# Patient Record
Sex: Female | Born: 1995
Health system: Southern US, Community
[De-identification: ages and names within clinical notes are randomized; demographics above are authoritative.]

## PROBLEM LIST (undated history)

## (undated) DIAGNOSIS — O1403 Mild to moderate pre-eclampsia, third trimester: Secondary | ICD-10-CM

## (undated) HISTORY — DX: Mild to moderate pre-eclampsia, third trimester: O14.03

---
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
## 2013-02-05 ENCOUNTER — Encounter (HOSPITAL_COMMUNITY): Payer: Self-pay | Admitting: Emergency Medicine

## 2013-02-05 ENCOUNTER — Emergency Department (HOSPITAL_COMMUNITY)
Admission: EM | Admit: 2013-02-05 | Discharge: 2013-02-05 | Disposition: A | Discharge status: Home / Self Care | Payer: Medicaid Other | Attending: Emergency Medicine | Admitting: Emergency Medicine

## 2013-02-05 ENCOUNTER — Emergency Department (HOSPITAL_COMMUNITY): Payer: Medicaid Other

## 2013-02-05 DIAGNOSIS — Y939 Activity, unspecified: Secondary | ICD-10-CM | POA: Insufficient documentation

## 2013-02-05 DIAGNOSIS — Z79899 Other long term (current) drug therapy: Secondary | ICD-10-CM | POA: Insufficient documentation

## 2013-02-05 DIAGNOSIS — S8992XA Unspecified injury of left lower leg, initial encounter: Secondary | ICD-10-CM

## 2013-02-05 DIAGNOSIS — Y9229 Other specified public building as the place of occurrence of the external cause: Secondary | ICD-10-CM | POA: Insufficient documentation

## 2013-02-05 DIAGNOSIS — W208XXA Other cause of strike by thrown, projected or falling object, initial encounter: Secondary | ICD-10-CM | POA: Insufficient documentation

## 2013-02-05 DIAGNOSIS — IMO0002 Reserved for concepts with insufficient information to code with codable children: Secondary | ICD-10-CM | POA: Insufficient documentation

## 2013-02-05 DIAGNOSIS — M79609 Pain in unspecified limb: Secondary | ICD-10-CM | POA: Insufficient documentation

## 2013-02-05 NOTE — ED Provider Notes (Signed)
CSN: [id number]     Arrival date & time 02/05/13  1310 History  This chart was scribed for non-physician practitioner, Emilia Beck, PA-C working with Gerhard Munch, MD by Greggory Stallion, ED scribe. This patient was seen in room TR07C/TR07C and the patient's care was started at 2:05 PM.   Chief Complaint  Patient presents with  . Leg Pain   The history is provided by the patient. No language interpreter was used.   HPI Comments: Melanie Burch is a 17 y.o. female who presents to the Emergency Department complaining of sudden onset, constant left calf pain that started last night after bricks fell onto her leg at a restaurant. Pt denies any other associated symptoms.   History reviewed. No pertinent past medical history. History reviewed. No pertinent past surgical history. History reviewed. No pertinent family history. History  Substance Use Topics  . Smoking status: Never Smoker   . Smokeless tobacco: Not on file  . Alcohol Use: No   OB History   Grav Para Term Preterm Abortions TAB SAB Ect Mult Living                 Review of Systems  Musculoskeletal: Positive for myalgias.  All other systems reviewed and are negative.    Allergies  Review of patient's allergies indicates no known allergies.  Home Medications   Current Outpatient Rx  Name  Route  Sig  Dispense  Refill  . bismuth subsalicylate (PEPTO BISMOL) 262 MG chewable tablet   Oral   Chew 524 mg by mouth as needed for indigestion.         . cephALEXin (KEFLEX) 500 MG capsule   Oral   Take 1 capsule (500 mg total) by mouth 4 (four) times daily.   28 capsule   0   . polyethylene glycol (MIRALAX / GLYCOLAX) packet   Oral   Take 17 g by mouth daily.   14 each   0   . senna (SENOKOT) 8.6 MG tablet   Oral   Take 2 tablets by mouth daily.          BP 128/76  Pulse 72  Temp(Src) 98.6 F (37 C) (Oral)  Resp 19  Wt 200 lb (90.719 kg)  SpO2 100%  Physical Exam  Nursing note and vitals  reviewed. Constitutional: She is oriented to person, place, and time. She appears well-developed and well-nourished. No distress.  HENT:  Head: Normocephalic and atraumatic.  Eyes: EOM are normal.  Neck: Neck supple. No tracheal deviation present.  Cardiovascular: Normal rate.   Pulmonary/Chest: Effort normal. No respiratory distress.  Musculoskeletal: Normal range of motion.  Superficial abrasion to left lateral lower leg. No tenderness to palpation or bruising noted. No tibial tenderness to palpation.   Neurological: She is alert and oriented to person, place, and time.  Skin: Skin is warm and dry.  Psychiatric: She has a normal mood and affect. Her behavior is normal.    ED Course  Procedures (including critical care time)  DIAGNOSTIC STUDIES: Oxygen Saturation is 100% on RA, normal by my interpretation.    COORDINATION OF CARE: 2:06 PM-Discussed treatment plan which includes tylenol or ibuprofen for pain with pt at bedside and pt agreed to plan.   Labs Review Labs Reviewed - No data to display Imaging Review Dg Tibia/fibula Left  02/05/2013   CLINICAL DATA:  Injury left lower leg.  Pain.  EXAM: LEFT TIBIA AND FIBULA - 2 VIEW  COMPARISON:  None.  FINDINGS: There is  no evidence of fracture or other focal bone lesions. Soft tissues are unremarkable.  IMPRESSION: Negative exam.   Electronically Signed   By: Drusilla Kanner M.D.   On: 02/05/2013 13:44    EKG Interpretation   None       MDM   1. Leg injury, left, initial encounter     2:09 PM Patient's xray unremarkable for acute changes. Vitals stable and patient afebrile. No obvious evidence of injury. Patient will be discharged without further evaluation.    I personally performed the services described in this documentation, which was scribed in my presence. The recorded information has been reviewed and is accurate.   Emilia Beck, PA-C 02/05/13 1415

## 2013-02-05 NOTE — ED Notes (Signed)
States bricks fell onto her L leg last night and shes having pain to anterior L calf area below knee since. Ambulatory, cms intact

## 2013-02-08 NOTE — ED Provider Notes (Signed)
  Medical screening examination/treatment/procedure(s) were performed by non-physician practitioner and as supervising physician I was immediately available for consultation/collaboration.      Zaron Zwiefelhofer, MD 02/08/13 0724 

## 2013-05-20 ENCOUNTER — Emergency Department (HOSPITAL_COMMUNITY)
Admission: EM | Admit: 2013-05-20 | Discharge: 2013-05-20 | Disposition: A | Discharge status: Home / Self Care | Payer: Medicaid Other | Attending: Emergency Medicine | Admitting: Emergency Medicine

## 2013-05-20 ENCOUNTER — Encounter (HOSPITAL_COMMUNITY): Payer: Self-pay | Admitting: Emergency Medicine

## 2013-05-20 DIAGNOSIS — K529 Noninfective gastroenteritis and colitis, unspecified: Secondary | ICD-10-CM

## 2013-05-20 DIAGNOSIS — K5289 Other specified noninfective gastroenteritis and colitis: Secondary | ICD-10-CM | POA: Insufficient documentation

## 2013-05-20 LAB — CBC WITH DIFFERENTIAL/PLATELET
Basophils Absolute: 0 10*3/uL (ref 0.0–0.1)
Basophils Relative: 0 % (ref 0–1)
Eosinophils Absolute: 0.2 K/uL (ref 0.0–0.7)
Eosinophils Relative: 2 % (ref 0–5)
HCT: 37.5 % (ref 36.0–46.0)
Hemoglobin: 12.6 g/dL (ref 12.0–15.0)
Lymphocytes Relative: 15 % (ref 12–46)
Lymphs Abs: 1.2 K/uL (ref 0.7–4.0)
MCH: 29.2 pg (ref 26.0–34.0)
MCHC: 33.6 g/dL (ref 30.0–36.0)
MCV: 86.8 fL (ref 78.0–100.0)
Monocytes Absolute: 0.4 10*3/uL (ref 0.1–1.0)
Monocytes Relative: 5 % (ref 3–12)
Neutro Abs: 6.1 10*3/uL (ref 1.7–7.7)
Neutrophils Relative %: 78 % — ABNORMAL HIGH (ref 43–77)
Platelets: 367 10*3/uL (ref 150–400)
RBC: 4.32 MIL/uL (ref 3.87–5.11)
RDW: 12.8 % (ref 11.5–15.5)
WBC: 7.9 K/uL (ref 4.0–10.5)

## 2013-05-20 LAB — COMPREHENSIVE METABOLIC PANEL
AST: 16 U/L (ref 0–37)
Albumin: 3.6 g/dL (ref 3.5–5.2)
Calcium: 9.1 mg/dL (ref 8.4–10.5)
Creatinine, Ser: 0.66 mg/dL (ref 0.50–1.10)
Sodium: 138 mEq/L (ref 137–147)

## 2013-05-20 LAB — COMPREHENSIVE METABOLIC PANEL WITH GFR
ALT: 13 U/L (ref 0–35)
Alkaline Phosphatase: 113 U/L (ref 39–117)
BUN: 8 mg/dL (ref 6–23)
CO2: 24 meq/L (ref 19–32)
Chloride: 104 meq/L (ref 96–112)
GFR calc Af Amer: >90 mL/min (ref >90)
GFR calc non Af Amer: >90 mL/min (ref >90)
Glucose, Bld: 74 mg/dL (ref 70–99)
Potassium: 4.1 meq/L (ref 3.7–5.3)
Total Bilirubin: 0.3 mg/dL (ref 0.3–1.2)
Total Protein: 8.3 g/dL (ref 6.0–8.3)

## 2013-05-20 LAB — POC URINE PREG, ED: Preg Test, Ur: NEGATIVE

## 2013-05-20 LAB — URINE MICROSCOPIC-ADD ON

## 2013-05-20 LAB — URINALYSIS, ROUTINE W REFLEX MICROSCOPIC
Bilirubin Urine: NEGATIVE
Glucose, UA: NEGATIVE mg/dL
Hgb urine dipstick: NEGATIVE
Ketones, ur: NEGATIVE mg/dL
Nitrite: NEGATIVE
Protein, ur: NEGATIVE mg/dL
Specific Gravity, Urine: 1.025 (ref 1.005–1.030)
Urobilinogen, UA: 1 mg/dL (ref 0.0–1.0)
pH: 5.5 (ref 5.0–8.0)

## 2013-05-20 LAB — LIPASE, BLOOD: Lipase: 18 U/L (ref 11–59)

## 2013-05-20 MED ORDER — SODIUM CHLORIDE 0.9 % IV BOLUS (SEPSIS)
1000.0000 mL | Freq: Once | INTRAVENOUS | Status: AC
  Administered 2013-05-20: 1000 mL via INTRAVENOUS

## 2013-05-20 MED ORDER — ONDANSETRON 4 MG PO TBDP
8.0000 mg | ORAL_TABLET | Freq: Once | ORAL | Status: DC
  Filled 2013-05-20: qty 2

## 2013-05-20 MED ORDER — ONDANSETRON HCL 4 MG/2ML IJ SOLN
4.0000 mg | Freq: Once | INTRAMUSCULAR | Status: AC
  Administered 2013-05-20: 4 mg via INTRAVENOUS
  Filled 2013-05-20: qty 2

## 2013-05-20 MED ORDER — ONDANSETRON 4 MG PO TBDP
8.0000 mg | ORAL_TABLET | Freq: Once | ORAL | Status: AC
  Administered 2013-05-20: 8 mg via ORAL
  Filled 2013-05-20: qty 2

## 2013-05-20 MED ORDER — CEPHALEXIN 500 MG PO CAPS: 500.0000 mg | ORAL_CAPSULE | Freq: Four times a day (QID) | ORAL | Status: DC

## 2013-05-20 MED ORDER — ONDANSETRON 8 MG PO TBDP: ORAL_TABLET | ORAL | Status: DC

## 2013-05-20 NOTE — ED Notes (Signed)
Pt states that she threw up the Zofran that she received in triage, but also states that she does not feel nauseous at this time.

## 2013-05-20 NOTE — ED Provider Notes (Signed)
Medical screening examination/treatment/procedure(s) were performed by non-physician practitioner and as supervising physician I was immediately available for consultation/collaboration.   EKG Interpretation None        Shenandoah Yeats Y. Nakenya Theall, MD 05/20/13 2229 

## 2013-05-20 NOTE — ED Notes (Signed)
Pt hasn't eaten all day and has been nauseated. Went to work at [employer] and tried drinking some sierra mist but vomited it back up and started having diarrhea which was around 1300.

## 2013-05-20 NOTE — ED Notes (Signed)
Muthersbaugh, PA is aware that the pt is having nausea.

## 2013-05-20 NOTE — ED Notes (Signed)
This RN went into the pt's room to give her zofran. Pt states that she was not feeling nauseous, stating that she had to "burp", which relieved the nauseous feeling. Muthersbaugh, PA is aware.

## 2013-05-20 NOTE — ED Notes (Signed)
This RN unable to find a site for an IV. Will ask another nurse.

## 2013-05-20 NOTE — Discharge Instructions (Signed)
1. Medications: keflex, zofran, usual home medications 2. Treatment: rest, drink plenty of fluids,  3. Follow Up: Please followup with your primary doctor for discussion of your diagnoses and further evaluation after today's visit; if you do not have a primary care doctor use the resource guide provided to find one;    Viral Gastroenteritis Viral gastroenteritis is also known as stomach flu. This condition affects the stomach and intestinal tract. It can cause sudden diarrhea and vomiting. The illness typically lasts 3 to 8 days. Most people develop an immune response that eventually gets rid of the virus. While this natural response develops, the virus can make you quite ill. CAUSES  Many different viruses can cause gastroenteritis, such as rotavirus or noroviruses. You can catch one of these viruses by consuming contaminated food or water. You may also catch a virus by sharing utensils or other personal items with an infected person or by touching a contaminated surface. SYMPTOMS  The most common symptoms are diarrhea and vomiting. These problems can cause a severe loss of body fluids (dehydration) and a body salt (electrolyte) imbalance. Other symptoms may include:  Fever.  Headache.  Fatigue.  Abdominal pain. DIAGNOSIS  Your caregiver can usually diagnose viral gastroenteritis based on your symptoms and a physical exam. A stool sample may also be taken to test for the presence of viruses or other infections. TREATMENT  This illness typically goes away on its own. Treatments are aimed at rehydration. The most serious cases of viral gastroenteritis involve vomiting so severely that you are not able to keep fluids down. In these cases, fluids must be given through an intravenous line (IV). HOME CARE INSTRUCTIONS   Drink enough fluids to keep your urine clear or pale yellow. Drink small amounts of fluids frequently and increase the amounts as tolerated.  Ask your caregiver for specific  rehydration instructions.  Avoid:  Foods high in sugar.  Alcohol.  Carbonated drinks.  Tobacco.  Juice.  Caffeine drinks.  Extremely hot or cold fluids.  Fatty, greasy foods.  Too much intake of anything at one time.  Dairy products until 24 to 48 hours after diarrhea stops.  You may consume probiotics. Probiotics are active cultures of beneficial bacteria. They may lessen the amount and number of diarrheal stools in adults. Probiotics can be found in yogurt with active cultures and in supplements.  Wash your hands well to avoid spreading the virus.  Only take over-the-counter or prescription medicines for pain, discomfort, or fever as directed by your caregiver. Do not give aspirin to children. Antidiarrheal medicines are not recommended.  Ask your caregiver if you should continue to take your regular prescribed and over-the-counter medicines.  Keep all follow-up appointments as directed by your caregiver. SEEK IMMEDIATE MEDICAL CARE IF:   You are unable to keep fluids down.  You do not urinate at least once every 6 to 8 hours.  You develop shortness of breath.  You notice blood in your stool or vomit. This may look like coffee grounds.  You have abdominal pain that increases or is concentrated in one small area (localized).  You have persistent vomiting or diarrhea.  You have a fever.  The patient is a child younger than 3 months, and he or she has a fever.  The patient is a child older than 3 months, and he or she has a fever and persistent symptoms.  The patient is a child older than 3 months, and he or she has a fever and symptoms  suddenly get worse.  The patient is a baby, and he or she has no tears when crying. MAKE SURE YOU:   Understand these instructions.  Will watch your condition.  Will get help right away if you are not doing well or get worse. Document Released: 02/28/2005 Document Revised: 05/23/2011 Document Reviewed:  12/15/2010 [facility] Patient Information 2014 [employer], [place].    Emergency Department Resource Guide 1) Find a Doctor and Pay Out of Pocket Although you won't have to find out who is covered by your insurance plan, it is a good idea to ask around and get recommendations. You will then need to call the office and see if the doctor you have chosen will accept you as a new patient and what types of options they offer for patients who are self-pay. Some doctors offer discounts or will set up payment plans for their patients who do not have insurance, but you will need to ask so you aren't surprised when you get to your appointment.  2) Contact Your Local Health Department Not all health departments have doctors that can see patients for sick visits, but many do, so it is worth a call to see if yours does. If you don't know where your local health department is, you can check in your phone book. The CDC also has a tool to help you locate your state's health department, and many state websites also have listings of all of their local health departments.  3) Find a Walk-in Clinic If your illness is not likely to be very severe or complicated, you may want to try a walk in clinic. These are popping up all over the country in pharmacies, drugstores, and shopping centers. They're usually staffed by nurse practitioners or physician assistants that have been trained to treat common illnesses and complaints. They're usually fairly quick and inexpensive. However, if you have serious medical issues or chronic medical problems, these are probably not your best option.  No Primary Care Doctor: - Call Health Connect at  [phone] - they can help you locate a primary care doctor that  accepts your insurance, provides certain services, etc. - Physician Referral Service- [phone]  Chronic Pain Problems: Organization         Address  Phone   Notes  [facility]  [phone] Patients need to  be referred by their primary care doctor.   Medication Assistance: Organization         Address  Phone   Notes  [facility] [address] [place], [place] [address] [phone]) [phone] --Must be a resident of [facility] [place] -- Must have NO insurance coverage whatsoever (no Medicaid/ Medicare, etc.) -- The pt. MUST have a primary care doctor that directs their care regularly and follows them in the community   MedAssist  [phone]   [employer]  [phone]) [phone]    Agencies that provide inexpensive medical care: Organization         Address  Phone   Notes  [facility]  [phone]) [phone]   [facility]    [phone]) [phone]   [facility] [address] [place], [place] [address] [phone]) [phone]   [facility] [place] [address], [place] [phone]   [facility]    [phone]   [facility]    [phone]   [facility]  [address], [place]  Phone:  [phone], Fax:  [phone] Hours of Operation:  9 am - 6 pm, M-F.  Also accepts Medicaid/Medicare and self-pay.  [facility]  [address], [place] Phone: [phone], Fax: [phone]. Hours of Operation:  8:30 am - 5:30 pm, M-F.  Also accepts Medicaid and self-pay.  [facility] [place] [address] [place] Phone: [phone]   [facility] [address] Natasha Bence [place], [place] [phone], Ext. [phone] Mondays & Thursdays: 7-9 AM.  First 15 patients are seen on a first come, first serve basis.    Medicaid-accepting [facility] Providers:  Organization         Address  Phone   Notes  [facility] [address], [place] [phone] Also accepts self-pay patients.  [facility] [address] Laurell Josephs [place], [place]  [phone]   [facility] [address], [place] [phone]   [facility] [address], [place] [phone]   Renaye Rakers [address], [place]   [phone] Only accepts [place] Access [place] patients after they have their name applied to their card.   Self-Pay (no insurance) in [facility]:  Organization         Address  Phone   Notes  Sickle Cell Patients, [facility] [address] [place], [place] [phone]   [facility] [address] [place], [place] [phone]   [facility] [place]  [address], [place] [phone]   [facility]/Dr. Osei-Bonsu  [address] [place] or [address], [place] [phone] Phone number for both [place] and [place] locations is the same.  [facility] [address] [place] [phone]   [facility] [address], [place] or [address] [phone]   [facility] [address] [place] [phone], phone; [phone], fax Sees patients 1st and 3rd Saturday of every month.  Must not qualify for public or private insurance (i.e. Medicaid, Medicare, [place] Health Choice, Veterans' Benefits)  Household income should be no more than 200% of the poverty level The clinic cannot treat you if you are pregnant or think you are pregnant  Sexually transmitted diseases are not treated at the clinic.    Dental Care: Organization         Address  Phone  Notes  [facility] Department of [facility] [address] [place], [place] [phone] Accepts children up to age 63 who are enrolled in [place] or [place] Health Choice; pregnant women with a Medicaid card; and children who have applied for Medicaid or [place] Health Choice, but were declined, whose parents can  pay a reduced fee at time of service.  [facility] Department of [facility]  [address], [place] [phone] Accepts children up to age 30 who are enrolled in [place] or [place] Health Choice; pregnant women with a Medicaid card; and children who have applied for Medicaid or [place] Health Choice, but were declined, whose parents can pay a reduced fee at time of service.  [facility]  [address] [place], [place] [phone] Patients are seen by appointment  only. Walk-ins are not accepted. [facility] will see patients 20 years of age and older. Monday - Tuesday (8am-5pm) Most Wednesdays (8:30-5pm) $30 per visit, cash only  [facility]  [address], [facility] [place] [phone] Patients are seen by appointment only. Walk-ins are not accepted. [facility] will see patients 20 years of age and older. One Wednesday Evening (Monthly: Volunteer Based).  $30 per visit, cash only  [facility]  [phone]) [phone] for adults; Children under age 154, call Graduate Pediatric Dentistry at [phone]. Children aged 234-14, please call [phone]) [phone] to request a pediatric application.  Dental services are provided in all areas of dental care including fillings, crowns and bridges, complete and partial dentures, implants, gum treatment, root canals, and extractions. Preventive care is also provided. Treatment is provided to both adults and children. Patients are selected via a lottery and there is often a waiting list.   [facility] [address], [place]  [phone]) [phone] [email or web address]   [facility] [address], [place], [place] [phone], Ext. [phone] Second and Fourth Thursday of each month, opens at 6:30 AM; Clinic ends at 9 AM.  Patients are seen on a first-come first-served basis, and a limited number are seen during each clinic.   [facility]  [address], [place], [place] [phone]) [phone]   Eligibility Requirements You must have lived in [place], [place], or [place] counties for at least the last three months.   You cannot be eligible for state or federal sponsored [place] Cityhealthcare insurance, including CIGNAVeterans Administration, [place]icaid, or [employer] EntertainmentMedicare.   You generally cannot be eligible for healthcare insurance through your employer.    How to apply: Eligibility screenings are held every Tuesday and Wednesday afternoon from 1:00 pm until 4:00 pm. You do not need an appointment for the interview!  [facility] [address], [place], [place] [phone]   [facility]  [phone]   [facility]  [phone]   [facility]  [phone]    Behavioral Health Resources in the Community: Intensive Outpatient Programs Organization         Address  Phone  Notes  [facility] [address], [place], [place] [phone]   [facility] [address], [place], [place] [phone]   [facility] [address], [place], [place]  [phone]   [facility] [address],  [place], [place] [phone] or [phone]   Substance Abuse Resources Organization         Address  Phone  Notes  [facility]  [phone]   [facility]  [phone]   [facility]  [phone]   [facility]  [phone]   Residential & Outpatient Substance Abuse Program  [phone]   Psychological Services Organization         Address  Phone  Notes  [facility]  [phone]- [phone]   [facility]  [phone]- [phone]   [facility] [address], [place] [phone] or [phone]    Mobile Crisis Teams Organization         Address  Phone  Notes  [facility], Mobile Crisis Care Unit  [phone]    [facility]  [address] [place], [place] [phone]   [facility] [address] [place] [phone]  Self-Help/Support Groups Organization         Address  Phone             Notes  [facility] of [place] - variety of support groups  336- I7437963 Call for more information  Narcotics Anonymous (NA), [facility] [address], [employer] Freedom  2 meetings at this location   Statistician         Address  Phone  Notes  [facility] [address],    [place]  [phone]   [facility]  [address] [place] [address], [place], [place] [phone]   [facility] [address] [place], [place] [phone] Admissions: 8am-3pm M-F  [facility] [address]    [place], [place] [phone]   [facility] [address] [place], [place], [place] [phone]   [facility] [address]  [place], [place] [phone]   [facility] - Intensive Outpatient [address]ure[address] Josephs [address], [place], [place] [phone]   [facility] ([facility] [address] [place].,  [place], [place] [phone] or [phone]   [facility] ([facility]) [address], [place], [place] [phone] Accepts Medicaid  [facility] [address],  [place] [phone] Substance Abuse/Addiction Treatment   [facility] Organization         Address  Phone  Notes  [facility]  [phone]   Angie Fava, PhD [address] Ervin Knack [place], [place]   [phone] or [phone]   [facility]   [address] [place], [place] [phone]   [facility] [address], [place], [place] [phone] Insurance/Medicaid/sponsorship through [facility] and Families [address]                                     [place], [place] [phone] Therapy/tele-psych/case  [facility] [address], [place] [phone]    Dr. Lolly Mustache  [phone]   [facility] [place]  [employer] [facility] Dept. 1) [address] [place] 2) [address], [place] 3)  [address], [place] [phone]  [phone]   [facility] Child Abuse Hotline (732)849-8000 or [phone] (After Hours)

## 2013-05-20 NOTE — ED Notes (Signed)
Muthersbaugh, PA at bedside.

## 2013-05-20 NOTE — ED Provider Notes (Signed)
CSN: [id number]     Arrival date & time 05/20/13  1728 History   First MD Initiated Contact with Patient 05/20/13 1921     Chief Complaint  Patient presents with  . Nausea     (Consider location/radiation/quality/duration/timing/severity/associated sxs/prior Treatment) The history is provided by the patient and medical records. No language interpreter was used.    Melanie Burch is a 18 y.o. female  with no medical Hx presents to the Emergency Department complaining of intermittent nausea and vomiting onset 1pm.  Pt reports she had associated intermittent mild abd cramping only just before she vomits and then symptoms are relieved after vomiting.  She reports NBNB emesis and diarrhea without frank blood or melena.  Pt denies fever, headache, neck pain, CP, SOB, weakness, dizziness, syncope, dysuria, hematuria.  Pt reports trying to drink sierra mist and ginger ale without relief.  She reports the RN gave a "nausea tablet" after which she vomited.  LMP: 1 week ago and normal.  Nothing makes her symptoms worse and nothing makes it better.  Denies vaginal ssx including vaginal discharge and odor.  Also denies lower abd pain.  History reviewed. No pertinent past medical history. History reviewed. No pertinent past surgical history. No family history on file. History  Substance Use Topics  . Smoking status: Never Smoker   . Smokeless tobacco: Not on file  . Alcohol Use: No   OB History   Grav Para Term Preterm Abortions TAB SAB Ect Mult Living                 Review of Systems  Constitutional: Negative for fever, diaphoresis, appetite change, fatigue and unexpected weight change.  HENT: Negative for mouth sores and trouble swallowing.   Respiratory: Negative for cough, chest tightness, shortness of breath, wheezing and stridor.   Cardiovascular: Negative for chest pain and palpitations.  Gastrointestinal: Positive for nausea, vomiting and abdominal pain (intermittent cramping).  Negative for diarrhea, constipation, blood in stool, abdominal distention and rectal pain.  Genitourinary: Negative for dysuria, urgency, frequency, hematuria, flank pain and difficulty urinating.  Musculoskeletal: Negative for back pain, neck pain and neck stiffness.  Skin: Negative for rash.  Neurological: Negative for weakness.  Hematological: Negative for adenopathy.  Psychiatric/Behavioral: Negative for confusion.  All other systems reviewed and are negative.      Allergies  Review of patient's allergies indicates no known allergies.  Home Medications   Current Outpatient Rx  Name  Route  Sig  Dispense  Refill  . ibuprofen (ADVIL,MOTRIN) 200 MG tablet   Oral   Take 400 mg by mouth daily as needed for cramping.         . cephALEXin (KEFLEX) 500 MG capsule   Oral   Take 1 capsule (500 mg total) by mouth 4 (four) times daily.   40 capsule   0   . ondansetron (ZOFRAN ODT) 8 MG disintegrating tablet      8mg  ODT q4 hours prn nausea   4 tablet   0    BP 128/65  Pulse 83  Temp(Src) 99.3 F (37.4 C) (Oral)  Resp 20  SpO2 100%  LMP 05/13/2013 Physical Exam  Nursing note and vitals reviewed. Constitutional: She is oriented to person, place, and time. She appears well-developed and well-nourished. No distress.  Awake, alert, nontoxic appearance  HENT:  Head: Normocephalic and atraumatic.  Mouth/Throat: Oropharynx is clear and moist. No oropharyngeal exudate.  Eyes: Conjunctivae are normal. Pupils are equal, round, and reactive to light. No  scleral icterus.  Neck: Normal range of motion. Neck supple.  Cardiovascular: Normal rate, regular rhythm, normal heart sounds and intact distal pulses.   No tachycardia  Pulmonary/Chest: Effort normal and breath sounds normal. No respiratory distress. She has no wheezes.  Clear and equal breath sounds  Abdominal: Soft. Bowel sounds are normal. She exhibits no distension and no mass. There is no tenderness. There is no rebound  and no guarding.  abd soft and nontender  Musculoskeletal: Normal range of motion. She exhibits no edema.  No edema  Lymphadenopathy:    She has no cervical adenopathy.  Neurological: She is alert and oriented to person, place, and time. She exhibits normal muscle tone. Coordination normal.  Speech is clear and goal oriented Moves extremities without ataxia  Skin: Skin is warm and dry. No rash noted. She is not diaphoretic. No erythema.  Psychiatric: She has a normal mood and affect.    ED Course  Procedures (including critical care time) Labs Review Labs Reviewed  CBC WITH DIFFERENTIAL - Abnormal; Notable for the following:    Neutrophils Relative % 78 (*)    All other components within normal limits  URINALYSIS, ROUTINE W REFLEX MICROSCOPIC - Abnormal; Notable for the following:    APPearance CLOUDY (*)    Leukocytes, UA SMALL (*)    All other components within normal limits  URINE MICROSCOPIC-ADD ON - Abnormal; Notable for the following:    Squamous Epithelial / LPF FEW (*)    Bacteria, UA MANY (*)    All other components within normal limits  URINE CULTURE  COMPREHENSIVE METABOLIC PANEL  LIPASE, BLOOD  POC URINE PREG, ED   Imaging Review No results found.   EKG Interpretation None      MDM   Final diagnoses:  Gastroenteritis   [name] presents with several episodes of vomiting and 1 episode of loose stool today.  No abd pain and benign abd.  Labs reassuring.  UA pending. Pt works at [employer] and is exposed to [employer].    Labs reassuring. Pregnancy test negative. No leukocytosis. Urinalysis with questionable urinary tract infection the patient denies symptoms.  Discussed with her.  Will write antibiotic prescription and culture.  9:33 PM Patient feels much better after fluid and Zofran administration. Patient's abdomen remains benign. She reports no further emesis and nausea has resolved. Will by mouth trial. If no further emesis in the department  we'll discharge home.  10:06 PM Patient with symptoms consistent with viral gastritis.  Vitals are stable, no fever.  Patient is nontoxic, nonseptic appearing, in no apparent distress.  Patient does not meet the SIRS or Sepsis criteria.  Pt's symptoms have been managed in the department; fluid bolus given.  No signs of dehydration, tolerating PO fluids > 6 oz.  Lungs are clear.  No focal abdominal pain, no peritoneal signs, no concern for appendicitis, cholecystitis, pancreatitis, ruptured viscus, UTI, kidney stone, PID, ectopic pregnancy or any other abdominal etiology.  Supportive therapy indicated with return if symptoms worsen.  Patient counseled.  I have also discussed reasons to return immediately to the ER.  Patient expresses understanding and agrees with plan.  It has been determined that no acute conditions requiring further emergency intervention are present at this time. The patient/guardian have been advised of the diagnosis and plan. We have discussed signs and symptoms that warrant return to the ED, such as changes or worsening in symptoms.   Vital signs are stable at discharge.   BP 128/65  Pulse 83  Temp(Src) 99.3 F (37.4 C) (Oral)  Resp 20  SpO2 100%  LMP 05/13/2013  Patient/guardian has voiced understanding and agreed to follow-up with the PCP or specialist.         Dierdre ForthHannah Devondre Guzzetta, PA-C 05/20/13 2206

## 2013-05-20 NOTE — ED Notes (Signed)
Pt called out reporting nausea.  

## 2013-05-22 LAB — URINE CULTURE: Colony Count: >=100000

## 2014-02-09 ENCOUNTER — Encounter (HOSPITAL_COMMUNITY): Payer: Self-pay | Admitting: *Deleted

## 2014-02-09 ENCOUNTER — Emergency Department (HOSPITAL_COMMUNITY)
Admission: EM | Admit: 2014-02-09 | Discharge: 2014-02-09 | Disposition: A | Discharge status: Home / Self Care | Payer: Medicaid Other | Attending: Emergency Medicine | Admitting: Emergency Medicine

## 2014-02-09 DIAGNOSIS — Z3202 Encounter for pregnancy test, result negative: Secondary | ICD-10-CM | POA: Insufficient documentation

## 2014-02-09 DIAGNOSIS — J040 Acute laryngitis: Secondary | ICD-10-CM | POA: Insufficient documentation

## 2014-02-09 DIAGNOSIS — L732 Hidradenitis suppurativa: Secondary | ICD-10-CM | POA: Diagnosis not present

## 2014-02-09 DIAGNOSIS — J029 Acute pharyngitis, unspecified: Secondary | ICD-10-CM | POA: Diagnosis present

## 2014-02-09 LAB — POC URINE PREG, ED: Preg Test, Ur: NEGATIVE

## 2014-02-09 LAB — MONONUCLEOSIS SCREEN: MONO SCREEN: NEGATIVE

## 2014-02-09 LAB — RAPID STREP SCREEN (MED CTR MEBANE ONLY): Streptococcus, Group A Screen (Direct): NEGATIVE

## 2014-02-09 MED ORDER — IBUPROFEN 100 MG/5ML PO SUSP
600.0000 mg | Freq: Once | ORAL | Status: AC
  Administered 2014-02-09: 600 mg via ORAL
  Filled 2014-02-09: qty 30

## 2014-02-09 MED ORDER — CEPHALEXIN 250 MG PO CAPS: 250.0000 mg | ORAL_CAPSULE | Freq: Four times a day (QID) | ORAL | Status: DC

## 2014-02-09 NOTE — Discharge Instructions (Signed)
Please call your doctor for a followup appointment within 24-48 hours. When you talk to your doctor please let them know that you were seen in the emergency department and have them acquire all of your records so that they can discuss the findings with you and formulate a treatment plan to fully care for your new and ongoing problems. Please call and set-up an appointment with ear, nose, throat physician Please call and set up an appointment with general surgery regarding recurrent skin infections Please take antibiotics as prescribed and on a full stomach Please apply warm compressions to the armpits bilaterally Please rest voice and drink warm fluids such as tea Please continue to monitor symptoms closely and if symptoms are to worsen or change (fever greater than 101, chills, sweating, nausea, vomiting, chest pain, shortness of breathe, difficulty breathing, weakness, numbness, tingling, worsening or changes to pain pattern, neck swelling, neck pain, inability to swallow, difficulty swallowing, red streaks, swelling to the arms, bleeding or drainage from the arms) please report back to the Emergency Department immediately.    Laryngitis Laryngitis is redness, soreness, and puffiness (inflammation) of the vocal cords. It causes hoarseness, cough, loss of voice, sore throat, and dry throat. It may be caused by:  Infection.  Too much smoking.  Too much talking or yelling.  Breathing in of toxic fumes.  Allergies.  A backup of acid from your stomach. HOME CARE  Drink enough fluids to keep your pee (urine) clear or pale yellow.  Rest until you no longer have problems or as told by your doctor.  Breathe in moist air.  Take all medicine as told by your doctor.  Do not smoke.  Talk as little as possible (this includes whispering).  Write on paper instead of talking until your voice is back to normal.  Follow up with your doctor if you have not improved after 10 days. GET HELP IF:     You have trouble breathing.  You cough up blood.  You have a fever that will not go away.  You have increasing pain.  You have trouble swallowing. MAKE SURE YOU:  Understand these instructions.  Will watch your condition.  Will get help right away if you are not doing well or get worse. Document Released: 02/17/2011 Document Revised: 05/23/2011 Document Reviewed: 02/17/2011 [facility] Patient Information 2015 [place]Care, [place]. This information is not intended to replace advice given to you by your health care provider. Make sure you discuss any questions you have with your health care provider.  Hidradenitis Suppurativa, Sweat Gland Abscess Hidradenitis suppurativa is a long lasting (chronic), uncommon disease of the sweat glands. With this, boil-like lumps and scarring develop in the groin, some times under the arms (axillae), and under the breasts. It may also uncommonly occur behind the ears, in the crease of the buttocks, and around the genitals.  CAUSES  The cause is from a blocking of the sweat glands. They then become infected. It may cause drainage and odor. It is not contagious. So it cannot be given to someone else. It most often shows up in puberty (about 6010 to 20 years of age). But it may happen much later. It is similar to acne which is a disease of the sweat glands. This condition is slightly more common in African-Americans and women. SYMPTOMS   Hidradenitis usually starts as one or more red, tender, swellings in the groin or under the arms (axilla).  Over a period of hours to days the lesions get larger. They  often open to the skin surface, draining clear to yellow-colored fluid.  The infected area heals with scarring. DIAGNOSIS  Your caregiver makes this diagnosis by looking at you. Sometimes cultures (growing germs on plates in the lab) may be taken. This is to see what germ (bacterium) is causing the infection.  TREATMENT   Topical germ killing medicine applied  to the skin (antibiotics) are the treatment of choice. Antibiotics taken by mouth (systemic) are sometimes needed when the condition is getting worse or is severe.  Avoid tight-fitting clothing which traps moisture in.  Dirt does not cause hidradenitis and it is not caused by poor hygiene.  Involved areas should be cleaned daily using an antibacterial soap. Some patients find that the liquid form of Lever 2000, applied to the involved areas as a lotion after bathing, can help reduce the odor related to this condition.  Sometimes surgery is needed to drain infected areas or remove scarred tissue. Removal of large amounts of tissue is used only in severe cases.  Birth control pills may be helpful.  Oral retinoids (vitamin A derivatives) for 6 to 12 months which are effective for acne may also help this condition.  Weight loss will improve but not cure hidradenitis. It is made worse by being overweight. But the condition is not caused by being overweight.  This condition is more common in people who have had acne.  It may become worse under stress. There is no medical cure for hidradenitis. It can be controlled, but not cured. The condition usually continues for years with periods of getting worse and getting better (remission). Document Released: 10/13/2003 Document Revised: 05/23/2011 Document Reviewed: 05/31/2013 [facility] Patient Information 2015 [place]ExitCare, [place]LLC. This information is not intended to replace advice given to you by your health care provider. Make sure you discuss any questions you have with your health care provider.   Emergency Department Resource Guide 1) Find a Doctor and Pay Out of Pocket Although you won't have to find out who is covered by your insurance plan, it is a good idea to ask around and get recommendations. You will then need to call the office and see if the doctor you have chosen will accept you as a new patient and what types of options they offer for  patients who are self-pay. Some doctors offer discounts or will set up payment plans for their patients who do not have insurance, but you will need to ask so you aren't surprised when you get to your appointment.  2) Contact Your Local Health Department Not all health departments have doctors that can see patients for sick visits, but many do, so it is worth a call to see if yours does. If you don't know where your local health department is, you can check in your phone book. The CDC also has a tool to help you locate your state's health department, and many state websites also have listings of all of their local health departments.  3) Find a Walk-in Clinic If your illness is not likely to be very severe or complicated, you may want to try a walk in clinic. These are popping up all over the country in pharmacies, drugstores, and shopping centers. They're usually staffed by nurse practitioners or physician assistants that have been trained to treat common illnesses and complaints. They're usually fairly quick and inexpensive. However, if you have serious medical issues or chronic medical problems, these are probably not your best option.  No Primary Care Doctor: - Call  Health Connect at  [phone] - they can help you locate a primary care doctor that  accepts your insurance, provides certain services, etc. - Physician Referral Service- [phone]  Chronic Pain Problems: Organization         Address  Phone   Notes  [facility]  [phone] Patients need to be referred by their primary care doctor.   Medication Assistance: Organization         Address  Phone   Notes  [facility] [address] [place], [place] [address] [phone] --Must be a resident of [facility] -- Must have NO insurance coverage whatsoever (no Medicaid/ Medicare, etc.) -- The pt. MUST have a primary care doctor that directs their care regularly  and follows them in the community   [facility]  [phone]   [employer]  [phone]    Agencies that provide inexpensive medical care: Organization         Address  Phone   Notes  [place]  [phone]   [facility]    [phone]   [facility] [place], [place] [address] [phone]   [place] [address] [place] [phone]   Planned Parenthood    [phone]   [facility]    [phone]   [place] [address], [place] Phone:  [phone], Fax:  [phone] Hours of Operation:  9 am - 6 pm, M-F.  Also accepts Medicaid/Medicare and self-pay.  [facility] for [place], [address], [place] Phone: [phone], Fax: [phone]. Hours of Operation:  8:30 am - 5:30 pm, M-F.  Also accepts Medicaid and self-pay.  [facility] [place] [address], [place] Phone: [phone]   [place], [place], [place] [phone], Ext. [phone] Mondays & Thursdays: 7-9 AM.  First 15 patients are seen on a first come, first serve basis.    [place] Providers:  Organization         Address  Phone   Notes  [facility] [address], [place] [phone] Also accepts self-pay patients.  [facility] [address], [place]  [phone]   [place], [address], [place] [phone]   [facility] [address], [place] [phone]   Lucianne Lei [address], [place]   [phone] Only accepts [place] Access [place] patients after they have their name applied to their card.   Self-Pay (no insurance) in [facility]:  Organization         Address  Phone   Notes  Sickle  Cell Patients, [facility] Internal [facility] [place] [phone]   [facility] [place] [phone]   [facility] [place]  [place], [place], [place] [phone]   [facility]/Dr. Osei-Bonsu  [address], [place] or [address], [place] [phone] Phone number for both [place] and [place] locations is the same.  [facility] [address] [place] [phone]   [place]  [address], [place] or [address] [phone]   [facility] [place] [phone], phone; [phone], fax Sees patients 1st and 3rd Saturday of every month.  Must not qualify for public or private insurance (i.e. Medicaid, Medicare, [place] Health Choice, Veterans' Benefits)  Household income should be no more than 200% of the poverty level The clinic cannot treat you if you are pregnant or think you are pregnant  Sexually transmitted diseases are not treated at the clinic.    Dental Care: Organization         Address  Phone  Notes  [facility] Department of [facility] [phone] Accepts children up to age 76 who are enrolled in [place] or [place]; pregnant women with a Medicaid card; and children who have applied for Medicaid or [place] Health Choice, but were declined, whose parents can pay a reduced fee at time of service.  [facility] of [facility]  [address], [place] [phone] Accepts children up to age 67 who are enrolled in [place] or [place]; pregnant women with a Medicaid card; and children who have applied for Medicaid or [place] Health Choice, but were declined, whose parents can pay a reduced fee at time of service.  [facility]  [place] [phone] Patients are seen by appointment only. Walk-ins are not accepted. Bernice will see patients 59 years of age and older. Monday - Tuesday (8am-5pm) Most Wednesdays (8:30-5pm) $30 per visit, cash only  [facility] Adult [facility] Access PROGRAM  [address], [place] [employer] [phone] Patients are seen by appointment only. Walk-ins are not accepted. Fernando Salinas will see patients 59 years of age and older. One Wednesday Evening (Monthly: Volunteer Based).  $30 per visit, cash only  [place]  [phone] for adults; Children under age 88, call Graduate Pediatric Dentistry at [phone]. Children aged 26-14, please call [phone] to request a pediatric application.  Dental services are provided in all areas of dental care including fillings, crowns and bridges, complete and partial dentures, implants, gum treatment, root canals, and extractions. Preventive care is also provided. Treatment is provided to both adults and children. Patients are selected via a lottery and there is often a waiting list.   [facility] [address] [place]  [phone] [email or web address]   [facility] [address] [place], [place] [phone], Ext. [phone] Second and Fourth Thursday of each month, opens at 6:30 AM; Clinic ends at 9 AM.  Patients are seen on a first-come first-served basis, and a limited number are seen during each clinic.   [facility]  [address] [place] Danker [place], [place] [phone]   Eligibility Requirements You must have lived in [place], [place], or [place] counties for at least the last three months.   You cannot be eligible for state or federal sponsored [employer], including [employer], [place], or [employer].   You generally cannot be eligible for healthcare insurance through your employer.    How to apply: Eligibility  screenings are held every Tuesday and Wednesday afternoon from 1:00 pm until 4:00 pm. You do not need an appointment for the interview!  [facility] [address], [place], [place]   [place]  [phone]   °[facility]  [phone]   °[facility]  336-570-6415   ° °Behavioral Health Resources in the Community: °Intensive Outpatient Programs °Organization         Address  Phone  Notes  °[facility] [address], [place], [place] [phone]   °[facility] [address], [place], [place] [phone]   °[facility] [address], [place], [place] ° 336-882-2125   °[facility] [address],  °[place], [place] 1-800-853-5163 or 336-641-4981   °Substance Abuse Resources °Organization         Address  Phone  Notes  °[facility]  336-882-2125   °[facility]  336-784-9470   °[facility]  336-285-9073   °[facility]  336-845-3988   °Residential & Outpatient Substance Abuse Program  1-800-659-3381   °Psychological Services °Organization         Address  Phone  Notes  °[facility]  336- [phone]   °[facility]  336- 378-7881   °[facility] [address], [place] 1-800-853-5163 or 336-641-4981   ° °Mobile Crisis Teams °Organization         Address  Phone  Notes  °[facility], Mobile Crisis Care Unit  1-877-626-1772   °Assertive °[facility] ° [address] [place], [place] 336-834-9664   °Sharon DeEsch [address] °[place] 336-554-5454   ° °Self-Help/Support Groups °Organization         Address  Phone             Notes  °[facility] [place] - variety of support groups  336- 373-1402 Call for more information  °Narcotics Anonymous (NA), [facility] [address], °[place]  2 meetings at  this location  ° °Residential Treatment Programs °Organization         Address  Phone  Notes  °[facility] [address],    °[place]  1-866-801-8205   °[facility] ° [address], [place], [place] [phone]   °[facility] [address], [place] 336-845-3988 Admissions: 8am-3pm M-F  °[facility] [address]    °[place], [place] 336-841-1104   °[facility] [address], [place], [place] 336-379-7146   °[facility] [address]  °[place], [place] 336-285-9073   °[facility] - Intensive Outpatient [address], [place], [place] 336-852-3033   °[facility] [address]  °[place], [place] 1-877-615-2722 or 336-784-9470   °[facility] ([facility]) [address] [place], [place] 336-227-7417 Accepts Medicaid  °[facility] [address]  °[place] 1-800-659-3381 Substance Abuse/Addiction Treatment  ° °[place] [facility] Health Resources °Organization         Address  Phone  Notes  °[facility]  (888) 581-9988   °Julie Brannon, PhD [address] [place], [place]   (336) 349-5553 or (336) 951-0000   °[facility]   [address] °, [place] (336) 349-4454   °[facility] [address], [place], [place] (336) 342-8316 Insurance/Medicaid/sponsorship through [facility]  °[facility] [address]                                      [place], [place] [phone] [place], [place] [phone]    Dr. Adele Schilder  [phone]   [facility] Dept. 1) [address] [place] 2) [place] 3)  [address], [place] [phone]  [phone]   [place] [phone] or [phone] (After Hours)

## 2014-02-09 NOTE — ED Provider Notes (Signed)
CSN: [id number]     Arrival date & time 02/09/14  [id number] History  This chart was scribed for Melanie MuttonMarissa Keiara Sneeringer, PA-C with Gilda Creasehristopher J. Pollina, MD by Tonye RoyaltyJoshua Chen, ED Scribe. This patient was seen in room TR08C/TR08C and the patient's care was started at 11:01 AM.    Chief Complaint  Patient presents with  . Sore Throat  . Recurrent Skin Infections   The history is provided by the patient. No language interpreter was used.    HPI Comments: Claria DiceCamree Siharath is a 18 y.o. female with no significant past medical history who presents to the Emergency Department complaining of constant sore throat with onset 1 month ago.  She states pain is worse at night, when talking a lot, and yelling. She states she has a burning sensation when trying to swallow at night but states she is still able to swallow normally. She repots associated voice change. She denies any sick contacts. She states she still has her tonsils. She states she has not tried any medication for her pain but has been trying to rest her voice. She denies cough, nasal congestion, fever, neck pain, pain when moving her neck, neck stiffness, neck swelling, difficulty swallowing, throat closing sensation, chest pain, shortness of breath, eye discomfort, ear pain, or coughing up blood.  She also notes abscesses that have been recurring since 4-5 years ago most often to her armpits but also affecting her thighs and back. She states they are painful and drain, but denies swelling or red streaking. She denies recent fever. She states these have been evaluated by her OBGYN, Dr. Tenny Crawoss, who placed her on Keflex 3 months ago; she denies improvement. She states she has had incision and drainage for the abscesses.  No PCP  History reviewed. No pertinent past medical history. History reviewed. No pertinent past surgical history. History reviewed. No pertinent family history. History  Substance Use Topics  . Smoking status: Never Smoker   . Smokeless  tobacco: Not on file  . Alcohol Use: No   OB History    No data available     Review of Systems  Constitutional: Negative for fever.  HENT: Positive for sore throat and voice change. Negative for congestion, ear pain and trouble swallowing.   Eyes: Negative for pain.  Respiratory: Negative for apnea, cough and shortness of breath.   Cardiovascular: Negative for chest pain.  Musculoskeletal: Negative for neck pain and neck stiffness.  Skin: Positive for color change.       abscesses      Allergies  Review of patient's allergies indicates no known allergies.  Home Medications   Prior to Admission medications   Medication Sig Start Date End Date Taking? Authorizing Provider  cephALEXin (KEFLEX) 250 MG capsule Take 1 capsule (250 mg total) by mouth 4 (four) times daily. 02/09/14   Emmalee Solivan, PA-C  ibuprofen (ADVIL,MOTRIN) 200 MG tablet Take 400 mg by mouth daily as needed for cramping.    Historical Provider, MD  ondansetron (ZOFRAN ODT) 8 MG disintegrating tablet 8mg  ODT q4 hours prn nausea 05/20/13   Hannah Muthersbaugh, PA-C   BP 127/72 mmHg  Pulse 77  Temp(Src) 99 F (37.2 C) (Oral)  Resp 20  Ht 5\' 4"  (1.626 m)  Wt 264 lb 5 oz (119.891 kg)  BMI 45.35 kg/m2  SpO2 100%  LMP 02/02/2014 Physical Exam  Constitutional: She is oriented to person, place, and time. She appears well-developed and well-nourished. No distress.  HENT:  Head: Normocephalic and atraumatic.  Mouth/Throat: Oropharynx is clear and moist. No oropharyngeal exudate.  Negative swelling, erythema, inflammation, lesions, sores, exudate, petechiae identified to the posterior oropharynx and bilateral tonsils. Negative tonsillar adenopathy. Uvula midline with symmetrical elevation. Negative uvula swelling. Negative trismus. Negative sublingual lesions.  Eyes: Conjunctivae and EOM are normal. Pupils are equal, round, and reactive to light. Right eye exhibits no discharge. Left eye exhibits no discharge.   Neck: Normal range of motion. Neck supple. No tracheal deviation present.  Negative neck stiffness Negative nuchal rigidity Negative cervical lymphadenopathy Negative meningeal signs  Cardiovascular: Normal rate, regular rhythm and normal heart sounds.   Pulses:      Radial pulses are 2+ on the right side, and 2+ on the left side.  Pulmonary/Chest: Effort normal and breath sounds normal. No respiratory distress. She has no wheezes. She has no rales.  Musculoskeletal: Normal range of motion.  Lymphadenopathy:    She has no cervical adenopathy.  Neurological: She is alert and oriented to person, place, and time. No cranial nerve deficit. She exhibits normal muscle tone. Coordination normal.  Skin: Skin is warm and dry. No rash noted. She is not diaphoretic. No erythema.  Indurated abscesses identified to the axilla bilaterally-1 on the left axilla that is actively draining. Copious amount of scar tissue identified to the axilla bilaterally. Negative red streaks, erythema, warmth upon palpation. Negative signs of surrounding cellulitic infection. Negative pain upon palpation.  Psychiatric: She has a normal mood and affect. Her behavior is normal. Thought content normal.  Nursing note and vitals reviewed.   ED Course  Procedures (including critical care time)  DIAGNOSTIC STUDIES: Oxygen Saturation is 100% on room air, normal by my interpretation.    COORDINATION OF CARE: 11:14 AM Discussed with patient that her strep is negative, and my suspicion of laryngitis, but will check for mono. Instructed her to return in case of worsening symptoms. The patient agrees with the plan and has no further questions at this time.  Results for orders placed or performed during the hospital encounter of 02/09/14  Rapid strep screen  Result Value Ref Range   Streptococcus, Group A Screen (Direct) NEGATIVE NEGATIVE  POC urine preg, ED ([facility])  Result Value Ref Range   Preg Test, Ur NEGATIVE  NEGATIVE    Labs Review Labs Reviewed  RAPID STREP SCREEN  CULTURE, GROUP A STREP  MONONUCLEOSIS SCREEN  POC URINE PREG, ED    Imaging Review No results found.   EKG Interpretation None      MDM   Final diagnoses:  Laryngitis  Hydradenitis    Medications  ibuprofen (ADVIL,MOTRIN) 100 MG/5ML suspension 600 mg (600 mg Oral Given 02/09/14 1107)    Filed Vitals:   02/09/14 0955 02/09/14 1116  BP: 121/67 127/72  Pulse: 75 77  Temp: 99.3 F (37.4 C) 99 F (37.2 C)  TempSrc: Oral Oral  Resp: 18 20  Height: 5\' 4"  (1.626 m)   Weight: 264 lb 5 oz (119.891 kg)   SpO2: 100% 100%   I personally performed the services described in this documentation, which was scribed in my presence. The recorded information has been reviewed and is accurate.  Urine pregnancy negative. Rapid strep test negative. Mono pending. Doubt streptococcal pharyngitis. Doubt retropharyngeal abscess. Doubt peritonsillar abscess. Doubt meningitis. Suspicion to be laryngitis, viral process. Patient presenting to the ED with hidradenitis suppurativa that has been ongoing for the past 4-5 years. Negative findings of acute cellulitic infection. Active drainage identified to the left axilla. Indurated abscesses  bilaterally with copious amount of scar tissue. This is a chronic issue. Patient stable, afebrile. Patient not septic appearing. Discharged patient. Referred patient to ear nose and throat physician as well as general surgery regarding hydradenitis suppurativa. Discussed with patient to rest and stay hydrated. Discussed with patient to apply warm compressions. Discharge patient with antibiotics. Discussed with patient to closely monitor symptoms and if symptoms are to worsen or change to report back to the ED - strict return instructions given.  Patient agreed to plan of care, understood, all questions answered.    Melanie Mutton, PA-C 02/09/14 1134  Gilda Crease, MD 02/13/14 [phone]

## 2014-02-09 NOTE — ED Notes (Signed)
Declined W/C at D/C and was escorted to lobby by RN. 

## 2014-02-09 NOTE — ED Notes (Signed)
Pt reports on going sore throat since 01-11-14. This is the first time TREATMENT HAS BEEN SOUGHT FOR SORE THROAT.

## 2014-02-11 LAB — CULTURE, GROUP A STREP

## 2014-04-10 ENCOUNTER — Emergency Department (HOSPITAL_COMMUNITY)
Admission: EM | Admit: 2014-04-10 | Discharge: 2014-04-11 | Disposition: A | Discharge status: Home / Self Care | Payer: BLUE CROSS/BLUE SHIELD | Attending: Emergency Medicine | Admitting: Emergency Medicine

## 2014-04-10 ENCOUNTER — Encounter (HOSPITAL_COMMUNITY): Payer: Self-pay | Admitting: Emergency Medicine

## 2014-04-10 DIAGNOSIS — L0291 Cutaneous abscess, unspecified: Secondary | ICD-10-CM

## 2014-04-10 DIAGNOSIS — Z792 Long term (current) use of antibiotics: Secondary | ICD-10-CM | POA: Diagnosis not present

## 2014-04-10 DIAGNOSIS — L02211 Cutaneous abscess of abdominal wall: Secondary | ICD-10-CM | POA: Diagnosis not present

## 2014-04-10 MED ORDER — LIDOCAINE-EPINEPHRINE (PF) 2 %-1:200000 IJ SOLN
10.0000 mL | Freq: Once | INTRAMUSCULAR | Status: AC
  Administered 2014-04-10: 10 mL
  Filled 2014-04-10: qty 20

## 2014-04-10 MED ORDER — HYDROCODONE-ACETAMINOPHEN 5-325 MG PO TABS: 1.0000 | ORAL_TABLET | Freq: Four times a day (QID) | ORAL | Status: DC | PRN

## 2014-04-10 MED ORDER — SULFAMETHOXAZOLE-TRIMETHOPRIM 800-160 MG PO TABS: 1.0000 | ORAL_TABLET | Freq: Two times a day (BID) | ORAL | Status: DC

## 2014-04-10 NOTE — Discharge Instructions (Signed)
Abscess °An abscess is an infected area that contains a collection of pus and debris. It can occur in almost any part of the body. An abscess is also known as a furuncle or boil. °CAUSES  °An abscess occurs when tissue gets infected. This can occur from blockage of oil or sweat glands, infection of hair follicles, or a minor injury to the skin. As the body tries to fight the infection, pus collects in the area and creates pressure under the skin. This pressure causes pain. People with weakened immune systems have difficulty fighting infections and get certain abscesses more often.  °SYMPTOMS °Usually an abscess develops on the skin and becomes a painful mass that is red, warm, and tender. If the abscess forms under the skin, you may feel a moveable soft area under the skin. Some abscesses break open (rupture) on their own, but most will continue to get worse without care. The infection can spread deeper into the body and eventually into the bloodstream, causing you to feel ill.  °DIAGNOSIS  °Your caregiver will take your medical history and perform a physical exam. A sample of fluid may also be taken from the abscess to determine what is causing your infection. °TREATMENT  °Your caregiver may prescribe antibiotic medicines to fight the infection. However, taking antibiotics alone usually does not cure an abscess. Your caregiver may need to make a small cut (incision) in the abscess to drain the pus. In some cases, gauze is packed into the abscess to reduce pain and to continue draining the area. °HOME CARE INSTRUCTIONS  °· Only take over-the-counter or prescription medicines for pain, discomfort, or fever as directed by your caregiver. °· If you were prescribed antibiotics, take them as directed. Finish them even if you start to feel better. °· If gauze is used, follow your caregiver's directions for changing the gauze. °· To avoid spreading the infection: °· Keep your draining abscess covered with a  bandage. °· Wash your hands well. °· Do not share personal care items, towels, or whirlpools with others. °· Avoid skin contact with others. °· Keep your skin and clothes clean around the abscess. °· Keep all follow-up appointments as directed by your caregiver. °SEEK MEDICAL CARE IF:  °· You have increased pain, swelling, redness, fluid drainage, or bleeding. °· You have muscle aches, chills, or a general ill feeling. °· You have a fever. °MAKE SURE YOU:  °· Understand these instructions. °· Will watch your condition. °· Will get help right away if you are not doing well or get worse. °Document Released: 12/08/2004 Document Revised: 08/30/2011 Document Reviewed: 05/13/2011 °ExitCare® Patient Information ©2015 [employer]. This information is not intended to replace advice given to you by your health care provider. Make sure you discuss any questions you have with your health care provider. ° °Abscess °Care After °An abscess (also called a boil or furuncle) is an infected area that contains a collection of pus. Signs and symptoms of an abscess include pain, tenderness, redness, or hardness, or you may feel a moveable soft area under your skin. An abscess can occur anywhere in the body. The infection may spread to surrounding tissues causing cellulitis. A cut (incision) by the surgeon was made over your abscess and the pus was drained out. Gauze may have been packed into the space to provide a drain that will allow the cavity to heal from the inside outwards. The boil may be painful for 5 to 7 days. Most people with a boil do not have   high fevers. Your abscess, if seen early, may not have localized, and may not have been lanced. If not, another appointment may be required for this if it does not get better on its own or with medications. °HOME CARE INSTRUCTIONS  °· Only take over-the-counter or prescription medicines for pain, discomfort, or fever as directed by your caregiver. °· When you bathe, soak and then  remove gauze or iodoform packs at least daily or as directed by your caregiver. You may then wash the wound gently with mild soapy water. Repack with gauze or do as your caregiver directs. °SEEK IMMEDIATE MEDICAL CARE IF:  °· You develop increased pain, swelling, redness, drainage, or bleeding in the wound site. °· You develop signs of generalized infection including muscle aches, chills, fever, or a general ill feeling. °· An oral temperature above 102° F (38.9° C) develops, not controlled by medication. °See your caregiver for a recheck if you develop any of the symptoms described above. If medications (antibiotics) were prescribed, take them as directed. °Document Released: 09/16/2004 Document Revised: 05/23/2011 Document Reviewed: 05/14/2007 °ExitCare® Patient Information ©2015 [employer]. This information is not intended to replace advice given to you by your health care provider. Make sure you discuss any questions you have with your health care provider. ° °

## 2014-04-10 NOTE — ED Provider Notes (Signed)
CSN: [id number]     Arrival date & time 04/10/14  2218 History   This chart was scribed for Melanie Horsemanobert Quinteria Chisum, PA-C working with Richardean Canalavid H Yao, MD by Evon Slackerrance Branch, ED Scribe. This patient was seen in room TR11C/TR11C and the patient's care was started at 10:30 PM.     Chief Complaint  Patient presents with  . Abscess   The history is provided by the patient. No language interpreter was used.   HPI Comments: Melanie Burch is a 19 y.o. female who presents to the Emergency Department complaining of recurrent progressively worsening abscess onset 4 days prior. Pt states that the abscess is located on the left side of her abdomen and has pain that radiates to her back. She has not taken anything for her symptoms.  It is aggravated by palpation and movement. Pt denies drainage  Pt states she has a Hx of abscesses.    History reviewed. No pertinent past medical history. History reviewed. No pertinent past surgical history. No family history on file. History  Substance Use Topics  . Smoking status: Never Smoker   . Smokeless tobacco: Not on file  . Alcohol Use: No   OB History    No data available     Review of Systems  Constitutional: Negative for fever and chills.  Respiratory: Negative for shortness of breath.   Cardiovascular: Negative for chest pain.  Gastrointestinal: Negative for nausea, vomiting, diarrhea and constipation.  Genitourinary: Negative for dysuria.  Skin: Positive for color change.       Abscess       Allergies  Review of patient's allergies indicates no known allergies.  Home Medications   Prior to Admission medications   Medication Sig Start Date End Date Taking? Authorizing Provider  cephALEXin (KEFLEX) 250 MG capsule Take 1 capsule (250 mg total) by mouth 4 (four) times daily. 02/09/14   Marissa Sciacca, PA-C  ibuprofen (ADVIL,MOTRIN) 200 MG tablet Take 400 mg by mouth daily as needed for cramping.    Historical Provider, MD  ondansetron (ZOFRAN  ODT) 8 MG disintegrating tablet 8mg  ODT q4 hours prn nausea 05/20/13   Hannah Muthersbaugh, PA-C   BP 122/83 mmHg  Pulse 107  Temp(Src) 98.6 F (37 C) (Oral)  Resp 20  SpO2 100%  LMP 04/03/2014   Physical Exam  Constitutional: She is oriented to person, place, and time. She appears well-developed and well-nourished. No distress.  HENT:  Head: Normocephalic and atraumatic.  Eyes: Conjunctivae and EOM are normal.  Neck: Neck supple. No tracheal deviation present.  Cardiovascular: Normal rate.   Pulmonary/Chest: Effort normal. No respiratory distress.  Musculoskeletal: Normal range of motion.  Neurological: She is alert and oriented to person, place, and time.  Skin: Skin is warm and dry.  3 x 3 cm abscess to left torso underneath pannus, with surrounding erythema concerning for cellulitis, moderately indurated  Psychiatric: She has a normal mood and affect. Her behavior is normal.  Nursing note and vitals reviewed.   ED Course  Procedures (including critical care time) DIAGNOSTIC STUDIES: Oxygen Saturation is 100% on RA, normal by my interpretation.    COORDINATION OF CARE: 10:57 PM-Discussed treatment plan with pt at bedside and pt agreed to plan.     Labs Review Labs Reviewed - No data to display  Imaging Review No results found.   EKG Interpretation None     EMERGENCY DEPARTMENT US SOFT TISSUE INTERPRETATION "Study: Limited Ultrasound of the noted body part in comments below"  INDICATIONS: Soft  tissue infection Multiple views of the body part are obtained with a multi-frequency linear probe  PERFORMED BY:  Myself  IMAGES ARCHIVED?: Yes  SIDE:Left  BODY PART:Abdominal wall  FINDINGS: Abcess present  LIMITATIONS:  Body Habitus  INTERPRETATION:  Abcess present  COMMENT:  Abscess with surrounding cellulitis  INCISION AND DRAINAGE Performed by: Melanie Burch Consent: Verbal consent obtained. Risks and benefits: risks, benefits and alternatives were  discussed Type: abscess  Body area: left torso under pannus  Anesthesia: local infiltration  Incision was made with a scalpel.  Local anesthetic: lidocaine 2% with epinephrine  Anesthetic total: 8 ml  Complexity: complex Blunt dissection to break up loculations  Drainage: purulent  Drainage amount: copious  Packing material: not packed  Patient tolerance: Patient tolerated the procedure well with no immediate complications.    MDM   Final diagnoses:  Abscess   Patient with skin abscess amenable to incision and drainage.  Abscess was not large enough to warrant packing or drain,  wound recheck in 2 days. Encouraged home warm soaks and flushing.  Mild signs of cellulitis is surrounding skin.  Will d/c to home.  Recommend follow-up with general surgery.   I personally performed the services described in this documentation, which was scribed in my presence. The recorded information has been reviewed and is accurate.        Melanie Horseman, PA-C 04/10/14 2359  Richardean Canal, MD 04/11/14 [phone]

## 2014-04-10 NOTE — ED Notes (Signed)
Pt. reports skin abscess at left lateral abdomen onset this Monday with no drainage .

## 2014-04-25 ENCOUNTER — Encounter (HOSPITAL_COMMUNITY): Payer: Self-pay | Admitting: Emergency Medicine

## 2014-04-25 ENCOUNTER — Emergency Department (INDEPENDENT_AMBULATORY_CARE_PROVIDER_SITE_OTHER)
Admission: EM | Admit: 2014-04-25 | Discharge: 2014-04-25 | Disposition: A | Discharge status: Home / Self Care | Payer: BLUE CROSS/BLUE SHIELD | Source: Home / Self Care | Attending: Emergency Medicine | Admitting: Emergency Medicine

## 2014-04-25 DIAGNOSIS — R69 Illness, unspecified: Principal | ICD-10-CM

## 2014-04-25 DIAGNOSIS — J111 Influenza due to unidentified influenza virus with other respiratory manifestations: Secondary | ICD-10-CM

## 2014-04-25 MED ORDER — OSELTAMIVIR PHOSPHATE 75 MG PO CAPS: 75.0000 mg | ORAL_CAPSULE | Freq: Two times a day (BID) | ORAL | Status: DC

## 2014-04-25 NOTE — ED Provider Notes (Signed)
CSN: [id number]     Arrival date & time 04/25/14  1046 History   First MD Initiated Contact with Patient 04/25/14 1110     Chief Complaint  Patient presents with  . URI   (Consider location/radiation/quality/duration/timing/severity/associated sxs/prior Treatment) HPI Comments: PCP: none Student and works at [employer] No flu shot this year Nonsmoker  Patient is a 19 y.o. female presenting with URI. The history is provided by the patient.  URI Presenting symptoms: congestion, cough, fatigue, fever, rhinorrhea and sore throat   Presenting symptoms: no ear pain and no facial pain   Severity:  Moderate Onset quality:  Gradual Duration:  2 days Timing:  Constant Progression:  Unchanged Chronicity:  New Associated symptoms: headaches and myalgias   Associated symptoms: no wheezing     History reviewed. No pertinent past medical history. History reviewed. No pertinent past surgical history. History reviewed. No pertinent family history. History  Substance Use Topics  . Smoking status: Never Smoker   . Smokeless tobacco: Not on file  . Alcohol Use: No   OB History    No data available     Review of Systems  Constitutional: Positive for fever and fatigue.  HENT: Positive for congestion, rhinorrhea and sore throat. Negative for ear pain.   Eyes: Negative.   Respiratory: Positive for cough. Negative for chest tightness, shortness of breath and wheezing.   Cardiovascular: Negative.   Gastrointestinal: Negative.   Musculoskeletal: Positive for myalgias.  Neurological: Positive for headaches.    Allergies  Review of patient's allergies indicates no known allergies.  Home Medications   Prior to Admission medications   Medication Sig Start Date End Date Taking? Authorizing Provider  cephALEXin (KEFLEX) 250 MG capsule Take 1 capsule (250 mg total) by mouth 4 (four) times daily. 02/09/14   Marissa Sciacca, PA-C  HYDROcodone-acetaminophen (NORCO/VICODIN) 5-325 MG per tablet  Take 1-2 tablets by mouth every 6 (six) hours as needed. 04/10/14   Roxy Horseman, PA-C  ibuprofen (ADVIL,MOTRIN) 200 MG tablet Take 400 mg by mouth daily as needed for cramping.    Historical Provider, MD  ondansetron (ZOFRAN ODT) 8 MG disintegrating tablet  ODT q4 hours prn nausea 05/20/13   Dahlia Client Muthersbaugh, PA-C  oseltamivir (TAMIFLU) 75 MG capsule Take 1 capsule (75 mg total) by mouth every 12 (twelve) hours. 04/25/14   Mathis Fare Shamaine Mulkern, PA  sulfamethoxazole-trimethoprim (SEPTRA DS) 800-160 MG per tablet Take 1 tablet by mouth every 12 (twelve) hours. 04/10/14   Roxy Horseman, PA-C   BP 113/79 mmHg  Pulse 104  Temp(Src) 99.8 F (37.7 C) (Oral)  Resp 20  SpO2 99%  LMP 04/03/2014 Physical Exam  Constitutional: She is oriented to person, place, and time. She appears well-developed and well-nourished. No distress.  HENT:  Head: Normocephalic and atraumatic.  Right Ear: Hearing, tympanic membrane, external ear and ear canal normal.  Left Ear: Hearing, tympanic membrane, external ear and ear canal normal.  Nose: Nose normal.  Mouth/Throat: Uvula is midline, oropharynx is clear and moist and mucous membranes are normal.  Eyes: Conjunctivae are normal. No scleral icterus.  Neck: Normal range of motion. Neck supple.  Cardiovascular: Normal rate, regular rhythm and normal heart sounds.   Pulmonary/Chest: Effort normal and breath sounds normal. No respiratory distress. She has no wheezes.  Musculoskeletal: Normal range of motion.  Lymphadenopathy:    She has no cervical adenopathy.  Neurological: She is alert and oriented to person, place, and time.  Skin: Skin is warm and dry.  Psychiatric:  She has a normal mood and affect. Her behavior is normal.  Nursing note and vitals reviewed.   ED Course  Procedures (including critical care time) Labs Review Labs Reviewed - No data to display  Imaging Review No results found.   MDM   1. Influenza-like illness    19 y/o O/W  healthy F with ILI with benign exam and normal vital signs Tamiflu and additional symptomatic care at home.    Ria ClockJennifer Lee H Jeanne Terrance, [place]PA 04/25/14 [phone]

## 2014-04-25 NOTE — ED Notes (Signed)
Pt here today for for cough, cold, and chills for the past week

## 2014-04-25 NOTE — Discharge Instructions (Signed)

## 2016-08-01 ENCOUNTER — Emergency Department (HOSPITAL_BASED_OUTPATIENT_CLINIC_OR_DEPARTMENT_OTHER)
Admission: EM | Admit: 2016-08-01 | Discharge: 2016-08-01 | Disposition: A | Discharge status: Home / Self Care | Payer: BLUE CROSS/BLUE SHIELD | Attending: Emergency Medicine | Admitting: Emergency Medicine

## 2016-08-01 ENCOUNTER — Encounter (HOSPITAL_BASED_OUTPATIENT_CLINIC_OR_DEPARTMENT_OTHER): Payer: Self-pay | Admitting: Emergency Medicine

## 2016-08-01 DIAGNOSIS — R112 Nausea with vomiting, unspecified: Secondary | ICD-10-CM | POA: Insufficient documentation

## 2016-08-01 DIAGNOSIS — R1084 Generalized abdominal pain: Secondary | ICD-10-CM | POA: Diagnosis not present

## 2016-08-01 DIAGNOSIS — R1033 Periumbilical pain: Secondary | ICD-10-CM | POA: Diagnosis present

## 2016-08-01 LAB — COMPREHENSIVE METABOLIC PANEL
ALBUMIN: 3.5 g/dL (ref 3.5–5.0)
ALK PHOS: 97 U/L (ref 38–126)
ALT: 17 U/L (ref 14–54)
AST: 18 U/L (ref 15–41)
Anion gap: 6 (ref 5–15)
BILIRUBIN TOTAL: 0.4 mg/dL (ref 0.3–1.2)
BUN: 6 mg/dL (ref 6–20)
CALCIUM: 8.9 mg/dL (ref 8.9–10.3)
CO2: 25 mmol/L (ref 22–32)
Chloride: 106 mmol/L (ref 101–111)
Creatinine, Ser: 0.63 mg/dL (ref 0.44–1.00)
GFR calc Af Amer: >60 mL/min (ref >60)
GFR calc non Af Amer: >60 mL/min (ref >60)
Glucose, Bld: 77 mg/dL (ref 65–99)
POTASSIUM: 3.3 mmol/L — AB (ref 3.5–5.1)
Sodium: 137 mmol/L (ref 135–145)
TOTAL PROTEIN: 7.9 g/dL (ref 6.5–8.1)

## 2016-08-01 LAB — URINALYSIS, MICROSCOPIC (REFLEX)

## 2016-08-01 LAB — CBC
HCT: 38.4 % (ref 36.0–46.0)
HEMOGLOBIN: 12.7 g/dL (ref 12.0–15.0)
MCH: 28.3 pg (ref 26.0–34.0)
MCHC: 33.1 g/dL (ref 30.0–36.0)
MCV: 85.5 fL (ref 78.0–100.0)
Platelets: 387 10*3/uL (ref 150–400)
RBC: 4.49 MIL/uL (ref 3.87–5.11)
RDW: 12.5 % (ref 11.5–15.5)
WBC: 7.8 10*3/uL (ref 4.0–10.5)

## 2016-08-01 LAB — URINALYSIS, ROUTINE W REFLEX MICROSCOPIC
Bilirubin Urine: NEGATIVE
GLUCOSE, UA: NEGATIVE mg/dL
Ketones, ur: NEGATIVE mg/dL
Nitrite: NEGATIVE
PH: 8 (ref 5.0–8.0)
Protein, ur: 100 mg/dL — AB
Specific Gravity, Urine: 1.011 (ref 1.005–1.030)

## 2016-08-01 LAB — LIPASE, BLOOD: Lipase: 18 U/L (ref 11–51)

## 2016-08-01 LAB — PREGNANCY, URINE: Preg Test, Ur: NEGATIVE

## 2016-08-01 MED ORDER — ONDANSETRON 4 MG PO TBDP: 4.0000 mg | ORAL_TABLET | Freq: Three times a day (TID) | ORAL | 0 refills | Status: DC | PRN

## 2016-08-01 MED ORDER — ONDANSETRON 8 MG PO TBDP
8.0000 mg | ORAL_TABLET | Freq: Once | ORAL | Status: AC
  Administered 2016-08-01: 8 mg via ORAL
  Filled 2016-08-01: qty 1

## 2016-08-01 MED FILL — ONDANSETRON ODT 4 MG TABLET: 4 | 4 days supply | Qty: 12 | Fill #0

## 2016-08-01 NOTE — ED Provider Notes (Signed)
[facility] Provider [facility]   CSN: [id number] Arrival date & time: 08/01/16  1253     History   Chief Complaint Chief Complaint  Patient presents with  . Abdominal Pain    HPI Melanie Burch is a 21 y.o. female presents today with complaints of abdominal pain that extends from throughout mid abdomen horizontal to periumbical region bilaterally. She reports pain started yesterday 3pm. She reports the pain sharp, intermittent, unchanged 9/10. She reports moving, coughing, palpation to area makes symptoms worse. Lying down makes her symptoms better and almost resolved. She reports trying "pepto" which she threw up. She reports not trying anything after that. She states she also tried drinking ginger ale and was not able to tolerate it. She admit to associated n/v, fevers, chills, dysuria, frequency, urgency. She denies having anything like this before. She reports going to dinner with some friends on Saturday and states her friends have been having n/v/d frequently since then. She denies previous abdominal surgeries. She denies recent URI, coughing, or congestion. LMP: 07/31/16.    The history is provided by the patient. No language interpreter was used.  Abdominal Pain   Associated symptoms include nausea and vomiting. Pertinent negatives include fever and diarrhea.    History reviewed. No pertinent past medical history.  There are no active problems to display for this patient.   History reviewed. No pertinent surgical history.  OB History    No data available       Home Medications    Prior to Admission medications   Medication Sig Start Date End Date Taking? Authorizing Provider  cephALEXin (KEFLEX) 250 MG capsule Take 1 capsule (250 mg total) by mouth 4 (four) times daily. 02/09/14   Sciacca, Marissa, PA-C  HYDROcodone-acetaminophen (NORCO/VICODIN) 5-325 MG per tablet Take 1-2 tablets by mouth every 6 (six) hours as needed. 04/10/14   Roxy Horseman, PA-C    ibuprofen (ADVIL,MOTRIN) 200 MG tablet Take 400 mg by mouth daily as needed for cramping.    [provider]  ondansetron (ZOFRAN ODT) 4 MG disintegrating tablet Take 1 tablet (4 mg total) by mouth every 8 (eight) hours as needed for nausea or vomiting. 08/01/16   Chidubem Chaires, Lucita Lora, PA  oseltamivir (TAMIFLU) 75 MG capsule Take 1 capsule (75 mg total) by mouth every 12 (twelve) hours. 04/25/14   Presson, Mathis Fare, PA  sulfamethoxazole-trimethoprim (SEPTRA DS) 800-160 MG per tablet Take 1 tablet by mouth every 12 (twelve) hours. 04/10/14   Roxy Horseman, PA-C    Family History History reviewed. No pertinent family history.  Social History Social History  Substance Use Topics  . Smoking status: Never Smoker  . Smokeless tobacco: Never Used  . Alcohol use No     Allergies   Patient has no known allergies.   Review of Systems Review of Systems  Constitutional: Negative for chills and fever.  Respiratory: Negative for cough and shortness of breath.   Cardiovascular: Negative for chest pain.  Gastrointestinal: Positive for abdominal pain, nausea and vomiting. Negative for blood in stool and diarrhea.  All other systems reviewed and are negative.    Physical Exam Updated Vital Signs BP 128/80 (BP Location: Right Arm)   Pulse 73   Temp 99.6 F (37.6 C) (Oral)   Resp 16   Ht 5\' 4"  (1.626 m)   Wt 124.7 kg (275 lb)   LMP 08/01/2016   SpO2 100%   BMI 47.20 kg/m   Physical Exam  Constitutional: She appears well-developed and  well-nourished. No distress.  Well appearing  HENT:  Head: Normocephalic and atraumatic.  Nose: Nose normal.  Eyes: Conjunctivae and EOM are normal.  Neck: Normal range of motion.  Cardiovascular: Normal rate, normal heart sounds and intact distal pulses.   No murmur heard. Pulmonary/Chest: Effort normal and breath sounds normal. No respiratory distress. She has no wheezes.  Normal work of breathing. No respiratory distress  noted.   Abdominal: Soft. Bowel sounds are normal. She exhibits no distension. There is tenderness. There is no rebound and no guarding.  Soft. Abdomen has generalized tenderness. Negative Murphy sign. No focal tenderness at McBurney's point. No CVA tenderness.  Musculoskeletal: Normal range of motion.  Neurological: She is alert.  Skin: Skin is warm.  Psychiatric: She has a normal mood and affect. Her behavior is normal.  Nursing note and vitals reviewed.    ED Treatments / Results  Labs (all labs ordered are listed, but only abnormal results are displayed) Labs Reviewed  URINALYSIS, ROUTINE W REFLEX MICROSCOPIC - Abnormal; Notable for the following:       Result Value   Color, Urine RED (*)    APPearance CLOUDY (*)    Hgb urine dipstick LARGE (*)    Protein, ur 100 (*)    Leukocytes, UA MODERATE (*)    All other components within normal limits  URINALYSIS, MICROSCOPIC (REFLEX) - Abnormal; Notable for the following:    Bacteria, UA FEW (*)    Squamous Epithelial / LPF 0-5 (*)    All other components within normal limits  COMPREHENSIVE METABOLIC PANEL - Abnormal; Notable for the following:    Potassium 3.3 (*)    All other components within normal limits  URINE CULTURE  PREGNANCY, URINE  LIPASE, BLOOD  CBC    EKG  EKG Interpretation None       Radiology No results found.  Procedures Procedures (including critical care time)  Medications Ordered in ED Medications  ondansetron (ZOFRAN-ODT) disintegrating tablet 8 mg (8 mg Oral Given 08/01/16 1514)     Initial Impression / Assessment and Plan / ED Course  I have reviewed the triage vital signs and the nursing notes.  Pertinent labs & imaging results that were available during my care of the patient were reviewed by me and considered in my medical decision making (see chart for details).    Patient with symptoms consistent with viral gastroenteritis.  Vitals are stable, no fever.  No signs of dehydration,  tolerating PO fluids > 6 oz.  Lungs are clear.  No focal abdominal pain, no concern for appendicitis, cholecystitis, pancreatitis, ruptured viscus, UTI, kidney stone, or any other abdominal etiology.  Supportive therapy indicated. Reasons to return to ED discussed.  Patient verbally understood and is in agreement with assessment and plan. Pt case discussed with Dr. Lynelle DoctorKnapp who agreed with assessment and plan.   Final Clinical Impressions(s) / ED Diagnoses   Final diagnoses:  Generalized abdominal pain    New Prescriptions Discharge Medication List as of 08/01/2016  4:02 PM       Alvina Chouspina, Delmont Prosch Manuel, [place] 08/01/16 1617    Linwood DibblesKnapp, Jon, MD 08/02/16 [id number]

## 2016-08-01 NOTE — ED Notes (Signed)
ED Provider at bedside. 

## 2016-08-01 NOTE — Discharge Instructions (Signed)
Please use Zofran as needed for nausea and vomiting. Please follow up with a primary care provider regarding today's visit. Please see financial resource guide attached.  Get help right away if: Your pain does not go away as soon as your health care provider told you to expect. You cannot stop throwing up. Your pain is only in areas of the abdomen, such as the right side or the left lower portion of the abdomen. You have bloody or black stools, or stools that look like tar. You have severe pain, cramping, or bloating in your abdomen. You have signs of dehydration, such as: Dark urine, very little urine, or no urine. Cracked lips. Dry mouth. Sunken eyes. Sleepiness. Weakness.

## 2016-08-01 NOTE — ED Triage Notes (Signed)
abd pain started yesterday with n/v.

## 2016-08-02 LAB — URINE CULTURE

## 2018-02-08 ENCOUNTER — Emergency Department (HOSPITAL_COMMUNITY)
Admission: EM | Admit: 2018-02-08 | Discharge: 2018-02-08 | Disposition: A | Discharge status: Home / Self Care | Payer: BLUE CROSS/BLUE SHIELD | Attending: Emergency Medicine | Admitting: Emergency Medicine

## 2018-02-08 ENCOUNTER — Other Ambulatory Visit: Payer: Self-pay

## 2018-02-08 ENCOUNTER — Emergency Department (HOSPITAL_COMMUNITY): Payer: BLUE CROSS/BLUE SHIELD

## 2018-02-08 ENCOUNTER — Encounter (HOSPITAL_COMMUNITY): Payer: Self-pay | Admitting: Emergency Medicine

## 2018-02-08 DIAGNOSIS — Z79899 Other long term (current) drug therapy: Secondary | ICD-10-CM | POA: Insufficient documentation

## 2018-02-08 DIAGNOSIS — M25561 Pain in right knee: Secondary | ICD-10-CM | POA: Insufficient documentation

## 2018-02-08 MED ORDER — IBUPROFEN 800 MG PO TABS
800.0000 mg | ORAL_TABLET | Freq: Once | ORAL | Status: AC
  Administered 2018-02-08: 800 mg via ORAL
  Filled 2018-02-08: qty 1

## 2018-02-08 MED ORDER — METHOCARBAMOL 500 MG PO TABS
500.0000 mg | ORAL_TABLET | Freq: Once | ORAL | Status: AC
  Administered 2018-02-08: 500 mg via ORAL
  Filled 2018-02-08: qty 1

## 2018-02-08 MED ORDER — ACETAMINOPHEN 325 MG PO TABS
650.0000 mg | ORAL_TABLET | Freq: Once | ORAL | Status: AC
  Administered 2018-02-08: 650 mg via ORAL
  Filled 2018-02-08: qty 2

## 2018-02-08 MED ORDER — IBUPROFEN 800 MG PO TABS: 800.0000 mg | ORAL_TABLET | Freq: Three times a day (TID) | ORAL | 0 refills | Status: DC | PRN

## 2018-02-08 MED ORDER — METHOCARBAMOL 500 MG PO TABS: 500.0000 mg | ORAL_TABLET | Freq: Two times a day (BID) | ORAL | 0 refills | Status: DC | PRN

## 2018-02-08 NOTE — ED Triage Notes (Signed)
Pt from home with c/o pain to right knee. Pt states when she tried to sit down, the top part of her knee "turned inward". Swelling noted to area. Pt states she was able to eventually get her knee back into place. Pt states she dislocated that knee about 10 years ago.

## 2018-02-08 NOTE — ED Notes (Signed)
Pt verbalized discharge instructions, follow up care and family is driving her home.

## 2018-02-08 NOTE — Discharge Instructions (Signed)
It was my pleasure taking care of you today!   Ibuprofen as needed for pain. Use crutches as needed for comfort. Ice and elevate knee throughout the day.  Call the orthopedist listed on Monday morning to schedule follow up appointment.  Return to the ER for new or worsening symptoms, any additional concerns.

## 2018-02-08 NOTE — ED Provider Notes (Signed)
[facility]-EMERGENCY DEPT Provider Note   CSN: [id number] Arrival date & time: 02/08/18  2147     History   Chief Complaint Chief Complaint  Patient presents with  . Knee Pain    right    HPI Irais Sides-Dalton is a 22 y.o. female.  The history is provided by the patient and medical records. No language interpreter was used.   Tamiah Sides-Dalton is a 22 y.o. female who presents to the Emergency Department complaining of acute onset of right knee pain which occurred just prior to emergency department arrival.  Patient states that she was at home and tried to sit down.  When she did, she twisted her knee oddly and her kneecap looked like it was on the lateral side of her leg and the rest of her knee turning inward.  She states that it was like this for about 5 minutes, but she was eventually able to get everything back into place.  She reports a history of a similar episode about 8 to 10 years ago where she was told that she dislocated her kneecap.  She will intermittently have some feelings of instability in her knee, but has not ever had another dislocation until possibly today.  She feels as if the knee is back in place now, however to have a very sore.  No medications taken prior to arrival for symptoms.  No numbness or tingling.  No weakness.    History reviewed. No pertinent past medical history.  There are no active problems to display for this patient.   History reviewed. No pertinent surgical history.   OB History   None      Home Medications    Prior to Admission medications   Medication Sig Start Date End Date Taking? Authorizing Provider  cephALEXin (KEFLEX) 250 MG capsule Take 1 capsule (250 mg total) by mouth 4 (four) times daily. 02/09/14   Sciacca, Marissa, PA-C  HYDROcodone-acetaminophen (NORCO/VICODIN) 5-325 MG per tablet Take 1-2 tablets by mouth every 6 (six) hours as needed. 04/10/14   Roxy HorsemanBrowning, Robert, PA-C  ibuprofen  (ADVIL,MOTRIN) 800 MG tablet Take 1 tablet (800 mg total) by mouth every 8 (eight) hours as needed for mild pain or moderate pain. 02/08/18   Fawzi Melman, Chase PicketJaime Pilcher, PA-C  methocarbamol (ROBAXIN) 500 MG tablet Take 1 tablet (500 mg total) by mouth 2 (two) times daily as needed for muscle spasms. 02/08/18   Wlliam Grosso, Chase PicketJaime Pilcher, PA-C  ondansetron (ZOFRAN ODT) 4 MG disintegrating tablet Take 1 tablet (4 mg total) by mouth every 8 (eight) hours as needed for nausea or vomiting. 08/01/16   Espina, Lucita LoraFrancisco Manuel, PA  oseltamivir (TAMIFLU) 75 MG capsule Take 1 capsule (75 mg total) by mouth every 12 (twelve) hours. 04/25/14   Presson, Mathis FareJennifer Lee H, PA  sulfamethoxazole-trimethoprim (SEPTRA DS) 800-160 MG per tablet Take 1 tablet by mouth every 12 (twelve) hours. 04/10/14   Roxy HorsemanBrowning, Robert, PA-C    Family History No family history on file.  Social History Social History   Tobacco Use  . Smoking status: Never Smoker  . Smokeless tobacco: Never Used  Substance Use Topics  . Alcohol use: Yes    Comment: socially   . Drug use: No     Allergies   Patient has no known allergies.   Review of Systems Review of Systems  Musculoskeletal: Positive for arthralgias and myalgias.  Skin: Negative for color change and wound.  Neurological: Negative for weakness and numbness.     Physical  Exam Updated Vital Signs BP (!) 144/77 (BP Location: Left Arm)   Pulse 70   Temp 98.9 F (37.2 C) (Oral)   Resp 18   LMP 02/08/2018   SpO2 100%   Physical Exam  Constitutional: She appears well-developed and well-nourished. No distress.  HENT:  Head: Normocephalic and atraumatic.  Neck: Neck supple.  Cardiovascular: Normal rate, regular rhythm and normal heart sounds.  No murmur heard. Pulmonary/Chest: Effort normal and breath sounds normal. No respiratory distress. She has no wheezes. She has no rales.  Musculoskeletal:  Diffuse tenderness to palpation of the anterior right knee.  Decreased ROM 2/2  pain. No joint effusion or swelling appreciated. No abnormal alignment or patellar mobility. No bruising, erythema or warmth overlaying the joint. No varus/valgus laxity. Negative drawer's, Lachman's and McMurray's.  No crepitus. 2+ DP pulses bilaterally. All compartments are soft. Sensation intact distal to injury.  Neurological: She is alert.  Skin: Skin is warm and dry.  Nursing note and vitals reviewed.    ED Treatments / Results  Labs (all labs ordered are listed, but only abnormal results are displayed) Labs Reviewed - No data to display  EKG None  Radiology Dg Knee Complete 4 Views Right  Result Date: 02/08/2018 CLINICAL DATA:  Knee spontaneously gave out earlier today. Right knee pain and swelling. EXAM: RIGHT KNEE - COMPLETE 4+ VIEW COMPARISON:  None. FINDINGS: No evidence of fracture, dislocation, or joint effusion. No evidence of arthropathy or other focal bone abnormality. Soft tissues are unremarkable. IMPRESSION: Negative. Electronically Signed   By: Myles Rosenthal M.D.   On: 02/08/2018 22:35    Procedures Procedures (including critical care time)  Medications Ordered in ED Medications  ibuprofen (ADVIL,MOTRIN) tablet 800 mg (has no administration in time range)  acetaminophen (TYLENOL) tablet 650 mg (has no administration in time range)  methocarbamol (ROBAXIN) tablet 500 mg (has no administration in time range)     Initial Impression / Assessment and Plan / ED Course  I have reviewed the triage vital signs and the nursing notes.  Pertinent labs & imaging results that were available during my care of the patient were reviewed by me and considered in my medical decision making (see chart for details).    Velinda Previti is a 22 y.o. female who presents to ED for acute onset of right knee pain just prior to arrival. From history gathered, sounds as if patient may have had a patellar dislocation at home. She reports that the kneecap did pop back in place after  about 5 minutes.  On exam today, extremities are neurovascularly intact.  She has good patellar mobility with normal alignment.  X-ray obtained which was negative.  Will place in knee immobilizer given her feelings of joint instability although joint is quite stable on exam.  Crutches provided.  Symptomatic home care instructions discussed.  Orthopedic follow-up strongly encouraged.  Reasons to return to the emergency department discussed and all questions answered.   Final Clinical Impressions(s) / ED Diagnoses   Final diagnoses:  Acute pain of right knee    ED Discharge Orders         Ordered    ibuprofen (ADVIL,MOTRIN) 800 MG tablet  Every 8 hours PRN     02/08/18 2246    methocarbamol (ROBAXIN) 500 MG tablet  2 times daily PRN     02/08/18 2246           Rejeana Fadness, Chase Picket, PA-C 02/08/18 2254    Pricilla Loveless, MD 02/08/18 2320

## 2018-04-19 ENCOUNTER — Other Ambulatory Visit: Payer: Self-pay

## 2018-04-19 ENCOUNTER — Encounter (HOSPITAL_COMMUNITY): Payer: Self-pay | Admitting: Emergency Medicine

## 2018-04-19 ENCOUNTER — Emergency Department (HOSPITAL_COMMUNITY): Payer: BLUE CROSS/BLUE SHIELD

## 2018-04-19 ENCOUNTER — Emergency Department (HOSPITAL_COMMUNITY)
Admission: EM | Admit: 2018-04-19 | Discharge: 2018-04-19 | Disposition: A | Discharge status: Home / Self Care | Payer: BLUE CROSS/BLUE SHIELD | Attending: Emergency Medicine | Admitting: Emergency Medicine

## 2018-04-19 DIAGNOSIS — S93401A Sprain of unspecified ligament of right ankle, initial encounter: Secondary | ICD-10-CM | POA: Diagnosis not present

## 2018-04-19 DIAGNOSIS — S99911A Unspecified injury of right ankle, initial encounter: Secondary | ICD-10-CM | POA: Diagnosis present

## 2018-04-19 DIAGNOSIS — W010XXA Fall on same level from slipping, tripping and stumbling without subsequent striking against object, initial encounter: Secondary | ICD-10-CM | POA: Insufficient documentation

## 2018-04-19 DIAGNOSIS — Y929 Unspecified place or not applicable: Secondary | ICD-10-CM | POA: Diagnosis not present

## 2018-04-19 DIAGNOSIS — Y9389 Activity, other specified: Secondary | ICD-10-CM | POA: Insufficient documentation

## 2018-04-19 DIAGNOSIS — Y998 Other external cause status: Secondary | ICD-10-CM | POA: Insufficient documentation

## 2018-04-19 MED ORDER — DICLOFENAC SODIUM 50 MG PO TBEC: 50.0000 mg | DELAYED_RELEASE_TABLET | Freq: Two times a day (BID) | ORAL | 0 refills | Status: DC

## 2018-04-19 NOTE — ED Provider Notes (Signed)
[facility]-EMERGENCY DEPT Provider Note   CSN: [id number] Arrival date & time: 04/19/18  1824     History   Chief Complaint Chief Complaint  Patient presents with  . Ankle Pain    HPI Melanie Burch is a 23 y.o. female. Who presents to the ED with right ankle pain after she tripped and fell yesterday. Pain increases with movement. Patient reports patella dislocation 11/19 and has had pain in the right knee off and on since then. Patient is scheduled to start PT for her knee.  HPI  History reviewed. No pertinent past medical history.  There are no active problems to display for this patient.   History reviewed. No pertinent surgical history.   OB History   No obstetric history on file.      Home Medications    Prior to Admission medications   Medication Sig Start Date End Date Taking? Authorizing Provider  diclofenac (VOLTAREN) 50 MG EC tablet Take 1 tablet (50 mg total) by mouth 2 (two) times daily. 04/19/18   Janne Napoleon, NP  methocarbamol (ROBAXIN) 500 MG tablet Take 1 tablet (500 mg total) by mouth 2 (two) times daily as needed for muscle spasms. 02/08/18   Ward, Chase Picket, PA-C    Family History No family history on file.  Social History Social History   Tobacco Use  . Smoking status: Never Smoker  . Smokeless tobacco: Never Used  Substance Use Topics  . Alcohol use: Yes    Comment: socially   . Drug use: No     Allergies   Patient has no known allergies.   Review of Systems Review of Systems  Musculoskeletal: Positive for arthralgias.  All other systems reviewed and are negative.    Physical Exam Updated Vital Signs BP (!) 149/88 (BP Location: Left Arm) Comment: Simultaneous filing. User may not have seen previous data.  Pulse 84 Comment: Simultaneous filing. User may not have seen previous data.  Temp 99.7 F (37.6 C) (Oral) Comment: Simultaneous filing. User may not have seen previous data. Comment (Src):  Simultaneous filing. User may not have seen previous data.  Resp 18 Comment: Simultaneous filing. User may not have seen previous data.  Wt 128.8 kg   LMP 04/08/2018   SpO2 100% Comment: Simultaneous filing. User may not have seen previous data.  BMI 48.73 kg/m   Physical Exam Vitals signs and nursing note reviewed.  Constitutional:      General: She is not in acute distress.    Appearance: She is well-developed.  HENT:     Head: Normocephalic.     Nose: Nose normal.  Eyes:     Conjunctiva/sclera: Conjunctivae normal.  Neck:     Musculoskeletal: Neck supple.  Cardiovascular:     Rate and Rhythm: Normal rate.  Pulmonary:     Effort: Pulmonary effort is normal.  Musculoskeletal:     Right ankle: She exhibits abnormal pulse. She exhibits no ecchymosis, no deformity and no laceration. Decreased range of motion: due to pain. Swelling: minimal. Tenderness. Medial malleolus tenderness found. Achilles tendon normal.     Comments: Pedal pulses 2+, strength equal.  Skin:    General: Skin is warm and dry.  Neurological:     Mental Status: She is alert and oriented to person, place, and time.  Psychiatric:        Mood and Affect: Mood normal.      ED Treatments / Results  Labs (all labs ordered are listed, but only  abnormal results are displayed) Labs Reviewed - No data to display  Radiology Dg Ankle Complete Right  Result Date: 04/19/2018 CLINICAL DATA:  Pain after trip and fall yesterday. EXAM: RIGHT ANKLE - COMPLETE 3+ VIEW COMPARISON:  None. FINDINGS: No fracture deformity nor dislocation. The ankle mortise appears congruent and the tibiofibular syndesmosis intact. No destructive bony lesions. Soft tissue planes are non-suspicious. IMPRESSION: Negative. Electronically Signed   By: Awilda Metroourtnay  Bloomer M.D.   On: 04/19/2018 19:05    Procedures Procedures (including critical care time)  Medications Ordered in ED Medications - No data to display   Initial Impression /  Assessment and Plan / ED Course  I have reviewed the triage vital signs and the nursing notes.  23 y.o. female here with right ankle pain s/p injury stable for d/c without fracture or dislocation noted on x-ray. No focal neuro deficits. ASO, NSAIDS and f/u as planned for PT of knee. Return precautions discussed.  Final Clinical Impressions(s) / ED Diagnoses   Final diagnoses:  Sprain of right ankle, unspecified ligament, initial encounter    ED Discharge Orders         Ordered    diclofenac (VOLTAREN) 50 MG EC tablet  2 times daily     04/19/18 2041           Kerrie Buffaloeese, Mairead Schwarzkopf ShumwayM, [place]NP 04/19/18 2046    Gerhard MunchLockwood, Robert, MD 04/19/18 [phone]

## 2018-04-19 NOTE — ED Triage Notes (Signed)
Patient c/o right ankle pain after trip and fall yesterday. Ambulatory. Reports pain worsens with movement. Denies head injury and LOC.

## 2018-09-27 ENCOUNTER — Other Ambulatory Visit: Payer: Self-pay

## 2018-09-27 DIAGNOSIS — Z20822 Contact with and (suspected) exposure to covid-19: Secondary | ICD-10-CM

## 2018-10-02 LAB — NOVEL CORONAVIRUS, NAA: SARS-CoV-2, NAA: DETECTED — AB

## 2019-07-22 ENCOUNTER — Other Ambulatory Visit: Payer: Self-pay

## 2019-07-22 ENCOUNTER — Emergency Department (HOSPITAL_BASED_OUTPATIENT_CLINIC_OR_DEPARTMENT_OTHER): Payer: Commercial Managed Care - PPO

## 2019-07-22 ENCOUNTER — Encounter (HOSPITAL_BASED_OUTPATIENT_CLINIC_OR_DEPARTMENT_OTHER): Payer: Self-pay | Admitting: *Deleted

## 2019-07-22 ENCOUNTER — Emergency Department (HOSPITAL_BASED_OUTPATIENT_CLINIC_OR_DEPARTMENT_OTHER)
Admission: EM | Admit: 2019-07-22 | Discharge: 2019-07-22 | Disposition: A | Discharge status: Home / Self Care | Payer: Commercial Managed Care - PPO | Attending: Emergency Medicine | Admitting: Emergency Medicine

## 2019-07-22 DIAGNOSIS — R1011 Right upper quadrant pain: Secondary | ICD-10-CM | POA: Diagnosis not present

## 2019-07-22 DIAGNOSIS — N10 Acute tubulo-interstitial nephritis: Secondary | ICD-10-CM | POA: Insufficient documentation

## 2019-07-22 DIAGNOSIS — N12 Tubulo-interstitial nephritis, not specified as acute or chronic: Secondary | ICD-10-CM

## 2019-07-22 DIAGNOSIS — R109 Unspecified abdominal pain: Secondary | ICD-10-CM | POA: Diagnosis present

## 2019-07-22 LAB — CBC WITH DIFFERENTIAL/PLATELET
Abs Immature Granulocytes: 0.05 10*3/uL (ref 0.00–0.07)
Basophils Absolute: 0 10*3/uL (ref 0.0–0.1)
Basophils Relative: 0 %
Eosinophils Absolute: 0.1 10*3/uL (ref 0.0–0.5)
Eosinophils Relative: 0 %
HCT: 40.9 % (ref 36.0–46.0)
Hemoglobin: 13.1 g/dL (ref 12.0–15.0)
Immature Granulocytes: 0 %
Lymphocytes Relative: 18 %
Lymphs Abs: 2.6 10*3/uL (ref 0.7–4.0)
MCH: 27.9 pg (ref 26.0–34.0)
MCHC: 32 g/dL (ref 30.0–36.0)
MCV: 87.2 fL (ref 80.0–100.0)
Monocytes Absolute: 1.1 10*3/uL — ABNORMAL HIGH (ref 0.1–1.0)
Monocytes Relative: 8 %
Neutro Abs: 10.6 10*3/uL — ABNORMAL HIGH (ref 1.7–7.7)
Neutrophils Relative %: 74 %
Platelets: 353 10*3/uL (ref 150–400)
RBC: 4.69 MIL/uL (ref 3.87–5.11)
RDW: 12.9 % (ref 11.5–15.5)
WBC: 14.5 10*3/uL — ABNORMAL HIGH (ref 4.0–10.5)
nRBC: 0 % (ref 0.0–0.2)

## 2019-07-22 LAB — COMPREHENSIVE METABOLIC PANEL
ALT: 13 U/L (ref 0–44)
AST: 11 U/L — ABNORMAL LOW (ref 15–41)
Albumin: 3.7 g/dL (ref 3.5–5.0)
Alkaline Phosphatase: 81 U/L (ref 38–126)
Anion gap: 9 (ref 5–15)
BUN: 7 mg/dL (ref 6–20)
CO2: 22 mmol/L (ref 22–32)
Calcium: 8.6 mg/dL — ABNORMAL LOW (ref 8.9–10.3)
Chloride: 104 mmol/L (ref 98–111)
Creatinine, Ser: 0.7 mg/dL (ref 0.44–1.00)
GFR calc Af Amer: >60 mL/min (ref >60)
GFR calc non Af Amer: >60 mL/min (ref >60)
Glucose, Bld: 102 mg/dL — ABNORMAL HIGH (ref 70–99)
Potassium: 3.8 mmol/L (ref 3.5–5.1)
Sodium: 135 mmol/L (ref 135–145)
Total Bilirubin: 0.9 mg/dL (ref 0.3–1.2)
Total Protein: 8.5 g/dL — ABNORMAL HIGH (ref 6.5–8.1)

## 2019-07-22 LAB — URINALYSIS, MICROSCOPIC (REFLEX)

## 2019-07-22 LAB — URINALYSIS, ROUTINE W REFLEX MICROSCOPIC
Bilirubin Urine: NEGATIVE
Glucose, UA: NEGATIVE mg/dL
Ketones, ur: NEGATIVE mg/dL
Nitrite: NEGATIVE
Protein, ur: 30 mg/dL — AB
Specific Gravity, Urine: 1.025 (ref 1.005–1.030)
pH: 6 (ref 5.0–8.0)

## 2019-07-22 LAB — PREGNANCY, URINE: Preg Test, Ur: NEGATIVE

## 2019-07-22 LAB — LIPASE, BLOOD: Lipase: 19 U/L (ref 11–51)

## 2019-07-22 MED ORDER — KETOROLAC TROMETHAMINE 30 MG/ML IJ SOLN
30.0000 mg | Freq: Once | INTRAMUSCULAR | Status: AC
  Administered 2019-07-22: 30 mg via INTRAVENOUS
  Filled 2019-07-22: qty 1

## 2019-07-22 MED ORDER — SODIUM CHLORIDE 0.9 % IV SOLN
1.0000 g | Freq: Once | INTRAVENOUS | Status: AC
  Administered 2019-07-22: 1 g via INTRAVENOUS
  Filled 2019-07-22: qty 10

## 2019-07-22 MED ORDER — IOHEXOL 300 MG/ML  SOLN
100.0000 mL | Freq: Once | INTRAMUSCULAR | Status: AC | PRN
  Administered 2019-07-22: 100 mL via INTRAVENOUS

## 2019-07-22 MED ORDER — CEPHALEXIN 500 MG PO CAPS: 500.0000 mg | ORAL_CAPSULE | Freq: Two times a day (BID) | ORAL | 0 refills | Status: AC

## 2019-07-22 MED ORDER — FENTANYL CITRATE (PF) 100 MCG/2ML IJ SOLN
100.0000 ug | Freq: Once | INTRAMUSCULAR | Status: AC
  Administered 2019-07-22: 100 ug via INTRAVENOUS
  Filled 2019-07-22: qty 2

## 2019-07-22 NOTE — ED Provider Notes (Signed)
[facility] EMERGENCY DEPARTMENT Provider Note   CSN: [id number] Arrival date & time: 07/22/19  0701     History Chief Complaint  Patient presents with  . Abdominal Pain    Melanie Burch is a 24 y.o. female.  24 year old female who presents with abdominal pain.  5 days ago, patient began having right-sided abdominal pain which was constant throughout the day.  She took Pepto-Bismol and an over-the-counter pain reliever which seemed to mildly help.  The following few days, the pain seemed to be improved but the pain started back up yesterday and has been persistent since then.  Pain is right-sided, nonradiating, burning.  Nothing makes it better or worse.  No association with eating.  She reports some mild urinary urgency but no other urinary symptoms.  No nausea, vomiting, diarrhea, constipation, vaginal bleeding, vaginal discharge.  She reports temp of 100.0 on the first day of the pain and 100.7 yesterday. Of note, tested negative for COVID-19 on 5/3.  The history is provided by the patient.  Abdominal Pain      History reviewed. No pertinent past medical history.  There are no problems to display for this patient.   History reviewed. No pertinent surgical history.   OB History   No obstetric history on file.     History reviewed. No pertinent family history.  Social History   Tobacco Use  . Smoking status: Never Smoker  . Smokeless tobacco: Never Used  Substance Use Topics  . Alcohol use: Yes    Comment: socially   . Drug use: No    Home Medications Prior to Admission medications   Medication Sig Start Date End Date Taking? Authorizing Provider  cephALEXin (KEFLEX) 500 MG capsule Take 1 capsule (500 mg total) by mouth 2 (two) times daily for 10 days. 07/22/19 08/01/19  Amiley Shishido, Ambrose Finland, MD    Allergies    Patient has no known allergies.  Review of Systems   Review of Systems  Gastrointestinal: Positive for abdominal pain.   All other  systems reviewed and are negative except that which was mentioned in HPI  Physical Exam Updated Vital Signs BP (!) 129/93 (BP Location: Right Arm)   Pulse (!) 114   Temp 98.9 F (37.2 C) (Oral)   Resp 18   Ht 5\' 4"  (1.626 m)   Wt 126.6 kg   SpO2 100%   BMI 47.89 kg/m   Physical Exam Vitals and nursing note reviewed.  Constitutional:      General: She is not in acute distress.    Appearance: She is well-developed.  HENT:     Head: Normocephalic and atraumatic.  Eyes:     Conjunctiva/sclera: Conjunctivae normal.  Cardiovascular:     Rate and Rhythm: Normal rate and regular rhythm.     Heart sounds: Normal heart sounds. No murmur.  Pulmonary:     Effort: Pulmonary effort is normal.     Breath sounds: Normal breath sounds.  Abdominal:     General: Bowel sounds are normal. There is no distension.     Palpations: Abdomen is soft.     Tenderness: There is abdominal tenderness in the right upper quadrant. There is no guarding or rebound. Negative signs include Murphy's sign.  Musculoskeletal:     Cervical back: Neck supple.  Skin:    General: Skin is warm and dry.  Neurological:     Mental Status: She is alert and oriented to person, place, and time.     Comments: Fluent  speech  Psychiatric:        Mood and Affect: Mood normal.        Judgment: Judgment normal.     ED Results / Procedures / Treatments   Labs (all labs ordered are listed, but only abnormal results are displayed) Labs Reviewed  COMPREHENSIVE METABOLIC PANEL - Abnormal; Notable for the following components:      Result Value   Glucose, Bld 102 (*)    Calcium 8.6 (*)    Total Protein 8.5 (*)    AST 11 (*)    All other components within normal limits  CBC WITH DIFFERENTIAL/PLATELET - Abnormal; Notable for the following components:   WBC 14.5 (*)    Neutro Abs 10.6 (*)    Monocytes Absolute 1.1 (*)    All other components within normal limits  URINALYSIS, ROUTINE W REFLEX MICROSCOPIC - Abnormal;  Notable for the following components:   APPearance CLOUDY (*)    Hgb urine dipstick SMALL (*)    Protein, ur 30 (*)    Leukocytes,Ua MODERATE (*)    All other components within normal limits  URINALYSIS, MICROSCOPIC (REFLEX) - Abnormal; Notable for the following components:   Bacteria, UA MANY (*)    All other components within normal limits  URINE CULTURE  PREGNANCY, URINE  LIPASE, BLOOD    EKG None  Radiology CT Abdomen Pelvis W Contrast  Result Date: 07/22/2019 CLINICAL DATA:  Right lower quadrant pain, fever leukocytosis. EXAM: CT ABDOMEN AND PELVIS WITH CONTRAST TECHNIQUE: Multidetector CT imaging of the abdomen and pelvis was performed using the standard protocol following bolus administration of intravenous contrast. CONTRAST:  100 mL OMNIPAQUE IOHEXOL 300 MG/ML  SOLN COMPARISON:  None. FINDINGS: Lower chest: Lung bases clear.  No pleural or pericardial effusion. Hepatobiliary: No focal liver abnormality is seen. No gallstones, gallbladder wall thickening, or biliary dilatation. Pancreas: Unremarkable. No pancreatic ductal dilatation or surrounding inflammatory changes. Spleen: Normal in size without focal abnormality. Adrenals/Urinary Tract: The adrenal glands appear normal. Abnormal decreased enhancement in the cortex of the right kidney is seen medially in the mid and lower poles and most consistent with pyelonephritis. The kidneys otherwise appear normal. No hydronephrosis or urinary tract stones. Urinary bladder and ureters are normal in appearance. Stomach/Bowel: Stomach is within normal limits. Appendix appears normal. No evidence of bowel wall thickening, distention, or inflammatory changes. Vascular/Lymphatic: No significant vascular findings are present. No enlarged abdominal or pelvic lymph nodes. Reproductive: Uterus and bilateral adnexa are unremarkable. Other: None. Musculoskeletal: Negative. IMPRESSION: Findings most consistent with pyelonephritis on the right. Negative for  abscess. Negative for appendicitis. Electronically Signed   By: Drusilla Kanner M.D.   On: 07/22/2019 11:14   US Abdomen Limited  Result Date: 07/22/2019 CLINICAL DATA:  Right upper quadrant pain EXAM: ULTRASOUND ABDOMEN LIMITED RIGHT UPPER QUADRANT COMPARISON:  None. FINDINGS: Gallbladder: No gallstones or wall thickening visualized. No sonographic Murphy sign noted by sonographer. Common bile duct: Diameter: 4 mm, normal Liver: No focal lesion identified. Within normal limits in parenchymal echogenicity. Portal vein is patent on color Doppler imaging with normal direction of blood flow towards the liver. Other: None. IMPRESSION: Normal ultrasound of the right upper quadrant. Electronically Signed   By: Guadlupe Spanish M.D.   On: 07/22/2019 10:35    Procedures Procedures (including critical care time)  Medications Ordered in ED Medications  cefTRIAXone (ROCEPHIN) 1 g in sodium chloride 0.9 % 100 mL IVPB (has no administration in time range)  ketorolac (TORADOL) 30 MG/ML injection  30 mg (has no administration in time range)  fentaNYL (SUBLIMAZE) injection 100 mcg (100 mcg Intravenous Given 07/22/19 0854)  iohexol (OMNIPAQUE) 300 MG/ML solution 100 mL (100 mLs Intravenous Contrast Given 07/22/19 1056)    ED Course  I have reviewed the triage vital signs and the nursing notes.  Pertinent labs & imaging results that were available during my care of the patient were reviewed by me and considered in my medical decision making (see chart for details).    MDM Rules/Calculators/A&P                      Afebrile and well appearing on exam, right mid and upper abdominal pain with no focal lower abdominal pain. Doubt bowel obstruction, colitis, diverticulitis, or ovarian pathology based on location of pain and symptoms. DDx includes appendicitis, gallbladder pathology, kidney stone. Labs show normal CMP, WBC 14.5, UA moderate leuks and many bacteria w/ WBC but also large sq epithelial cells. Added  urine culture. RUQ US unremarkable. Proceeded w/ CT abd/pelvis which showed R pyelonephritis, no evidence of obstruction/stones. Appendix normal. Gave CTX and toradol. Discussed treatment w/ abx and extensively reviewed return precautions. She voiced understanding. Final Clinical Impression(s) / ED Diagnoses Final diagnoses:  Pyelonephritis    Rx / DC Orders ED Discharge Orders         Ordered    cephALEXin (KEFLEX) 500 MG capsule  2 times daily     07/22/19 1138           Jianna Drabik, Wenda Overland, MD 07/22/19 1140

## 2019-07-22 NOTE — ED Notes (Signed)
ED Provider at bedside. 

## 2019-07-22 NOTE — ED Triage Notes (Signed)
Having rt side abd pain, onset this past wed, denies N/V/D, appetite poor

## 2019-07-24 LAB — URINE CULTURE: Culture: >=100000 — AB

## 2019-07-25 ENCOUNTER — Telehealth: Payer: Self-pay

## 2019-07-25 NOTE — Telephone Encounter (Signed)
Post ED Visit - Positive Culture Follow-up  Culture report reviewed by antimicrobial stewardship pharmacist: [facility] []  , Pharm.D. []  Enzo Bi, Pharm.D., BCPS AQ-ID []  , Pharm.D., BCPS []  Celedonio Miyamoto, Pharm.D., BCPS []  [place], Garvin Fila.D., BCPS, AAHIVP []  , Pharm.D., BCPS, AAHIVP []  Georgina Pillion, PharmD, BCPS []  , PharmD, BCPS []  [place], PharmD, BCPS []  [place], PharmD []  , PharmD, BCPS []  Estella Husk, PharmD [facility] []  Lysle Pearl, PharmD []  , PharmD []  Phillips Climes, PharmD []  , Rph []  Agapito Games) , PharmD []  Verlan Friends, PharmD []  , PharmD []  Mervyn Gay, PharmD []  , PharmD []  Vinnie Level, PharmD []  Yolande Jolly, PharmD []  , PharmD []  Len Childs, PharmD   Positive urine culture Treated with Cephalexin, organism sensitive to the same and no further patient follow-up is required at this time.  07/25/2019, 9:43 AM

## 2019-09-15 ENCOUNTER — Emergency Department (HOSPITAL_BASED_OUTPATIENT_CLINIC_OR_DEPARTMENT_OTHER)
Admission: EM | Admit: 2019-09-15 | Discharge: 2019-09-16 | Disposition: A | Discharge status: Home / Self Care | Payer: Commercial Managed Care - PPO | Attending: Emergency Medicine | Admitting: Emergency Medicine

## 2019-09-15 ENCOUNTER — Other Ambulatory Visit: Payer: Self-pay

## 2019-09-15 ENCOUNTER — Encounter (HOSPITAL_BASED_OUTPATIENT_CLINIC_OR_DEPARTMENT_OTHER): Payer: Self-pay

## 2019-09-15 DIAGNOSIS — B0082 Herpes simplex myelitis: Secondary | ICD-10-CM | POA: Insufficient documentation

## 2019-09-15 DIAGNOSIS — Z202 Contact with and (suspected) exposure to infections with a predominantly sexual mode of transmission: Secondary | ICD-10-CM | POA: Diagnosis not present

## 2019-09-15 DIAGNOSIS — R102 Pelvic and perineal pain: Secondary | ICD-10-CM | POA: Diagnosis present

## 2019-09-15 DIAGNOSIS — B009 Herpesviral infection, unspecified: Secondary | ICD-10-CM

## 2019-09-15 DIAGNOSIS — Z711 Person with feared health complaint in whom no diagnosis is made: Secondary | ICD-10-CM

## 2019-09-15 LAB — URINALYSIS, ROUTINE W REFLEX MICROSCOPIC
Bilirubin Urine: NEGATIVE
Glucose, UA: NEGATIVE mg/dL
Hgb urine dipstick: NEGATIVE
Ketones, ur: NEGATIVE mg/dL
Nitrite: NEGATIVE
Protein, ur: NEGATIVE mg/dL
Specific Gravity, Urine: >1.03 — ABNORMAL HIGH (ref 1.005–1.030)
pH: 6 (ref 5.0–8.0)

## 2019-09-15 LAB — WET PREP, GENITAL
Clue Cells Wet Prep HPF POC: NONE SEEN
Sperm: NONE SEEN
Trich, Wet Prep: NONE SEEN
Yeast Wet Prep HPF POC: NONE SEEN

## 2019-09-15 LAB — URINALYSIS, MICROSCOPIC (REFLEX)

## 2019-09-15 LAB — PREGNANCY, URINE: Preg Test, Ur: NEGATIVE

## 2019-09-15 MED ORDER — LIDOCAINE HCL URETHRAL/MUCOSAL 2 % EX GEL
1.0000 "application " | Freq: Once | CUTANEOUS | Status: AC
  Administered 2019-09-15: 1 via TOPICAL
  Filled 2019-09-15: qty 20

## 2019-09-15 MED ORDER — CEFTRIAXONE SODIUM 500 MG IJ SOLR
500.0000 mg | Freq: Once | INTRAMUSCULAR | Status: AC
  Administered 2019-09-15: 500 mg via INTRAMUSCULAR
  Filled 2019-09-15: qty 500

## 2019-09-15 MED ORDER — DOXYCYCLINE HYCLATE 100 MG PO CAPS
100.0000 mg | ORAL_CAPSULE | Freq: Two times a day (BID) | ORAL | 0 refills | Status: DC
Start: 2019-09-15 — End: 2020-12-11

## 2019-09-15 MED ORDER — DOXYCYCLINE HYCLATE 100 MG PO TABS
100.0000 mg | ORAL_TABLET | Freq: Once | ORAL | Status: AC
  Administered 2019-09-15: 100 mg via ORAL
  Filled 2019-09-15: qty 1

## 2019-09-15 MED ORDER — VALACYCLOVIR HCL 500 MG PO TABS
1000.0000 mg | ORAL_TABLET | Freq: Once | ORAL | Status: AC
  Administered 2019-09-15: 1000 mg via ORAL
  Filled 2019-09-15: qty 2

## 2019-09-15 MED ORDER — LIDOCAINE HCL (PF) 1 % IJ SOLN
2.0000 mL | Freq: Once | INTRAMUSCULAR | Status: AC
  Administered 2019-09-15: 2 mL
  Filled 2019-09-15: qty 5

## 2019-09-15 MED ORDER — VALACYCLOVIR HCL 1 G PO TABS
1000.0000 mg | ORAL_TABLET | Freq: Two times a day (BID) | ORAL | 0 refills | Status: DC
Start: 2019-09-15 — End: 2022-07-27

## 2019-09-15 NOTE — Discharge Instructions (Addendum)
You were seen today for vaginal pain.  Your exam is concerning for herpes.  Testing is pending.  You will be started on Valtrex.  Take for 10 days.  You should abstain from sexual activity until completely healed.  Always use condoms.  You were also tested and treated for other STDs.

## 2019-09-15 NOTE — ED Provider Notes (Signed)
[facility] EMERGENCY DEPARTMENT Provider Note   CSN: [id number] Arrival date & time: 09/15/19  2204     History Chief Complaint  Patient presents with  . Vaginal Pain    Melanie Burch is a 24 y.o. female.  HPI     This is a 24 year old female with no significant past medical history who presents with vaginal irritation. Patient reports 3-day history of burning in the vaginal area. She states that the burning is worse after urination. She has had similar symptoms in the past and has a history of trichomonas. She states that she started taking her antibiotics again but they did not help. She also used a Monistat suppository which also did not help. She does report a new sexual partner and inconsistent condom use. No known history of herpes or genital lesions. Partner has no significant symptoms. Denies hematuria. Denies back pain or fevers.  History reviewed. No pertinent past medical history.  There are no problems to display for this patient.   History reviewed. No pertinent surgical history.   OB History   No obstetric history on file.     No family history on file.  Social History   Tobacco Use  . Smoking status: Never Smoker  . Smokeless tobacco: Never Used  Vaping Use  . Vaping Use: Never used  Substance Use Topics  . Alcohol use: Yes    Comment: socially   . Drug use: No    Home Medications Prior to Admission medications   Medication Sig Start Date End Date Taking? Authorizing Provider  doxycycline (VIBRAMYCIN) 100 MG capsule Take 1 capsule (100 mg total) by mouth 2 (two) times daily. 09/15/19   Aryiana Klinkner, Mayer Masker, MD  valACYclovir (VALTREX) 1000 MG tablet Take 1 tablet (1,000 mg total) by mouth 2 (two) times daily. 09/15/19   Shanah Guimaraes, Mayer Masker, MD    Allergies    Patient has no known allergies.  Review of Systems   Review of Systems  Constitutional: Negative for fever.  Gastrointestinal: Negative for abdominal pain, nausea and vomiting.   Genitourinary: Positive for dysuria and vaginal pain. Negative for hematuria, vaginal bleeding and vaginal discharge.  All other systems reviewed and are negative.   Physical Exam Updated Vital Signs BP (!) 136/95 (BP Location: Right Arm)   Pulse 90   Temp 98.4 F (36.9 C) (Oral)   Resp 20   Ht 1.626 m (5\' 4" )   Wt 122.5 kg   LMP 08/29/2019   SpO2 100%   BMI 46.35 kg/m   Physical Exam Vitals and nursing note reviewed. Exam conducted with a chaperone present.  Constitutional:      Appearance: She is well-developed. She is obese. She is not ill-appearing.  HENT:     Head: Normocephalic and atraumatic.     Mouth/Throat:     Mouth: Mucous membranes are moist.  Eyes:     Pupils: Pupils are equal, round, and reactive to light.  Cardiovascular:     Rate and Rhythm: Normal rate and regular rhythm.  Pulmonary:     Effort: Pulmonary effort is normal. No respiratory distress.  Abdominal:     Palpations: Abdomen is soft.     Tenderness: There is no abdominal tenderness.  Genitourinary:    Comments: External vaginal exam with multiple subcentimeter ulcerations over the labia majora and minora at the vaginal introitus, tender to touch, patient did not tolerate speculum exam. Blind swabs made, no crepitus noted of the perineum Skin:    General: Skin  is warm and dry.  Neurological:     Mental Status: She is alert and oriented to person, place, and time.  Psychiatric:        Mood and Affect: Mood normal.     ED Results / Procedures / Treatments   Labs (all labs ordered are listed, but only abnormal results are displayed) Labs Reviewed  URINALYSIS, ROUTINE W REFLEX MICROSCOPIC - Abnormal; Notable for the following components:      Result Value   APPearance CLOUDY (*)    Specific Gravity, Urine >1.030 (*)    Leukocytes,Ua MODERATE (*)    All other components within normal limits  URINALYSIS, MICROSCOPIC (REFLEX) - Abnormal; Notable for the following components:   Bacteria, UA  MANY (*)    All other components within normal limits  WET PREP, GENITAL  HSV CULTURE AND TYPING  PREGNANCY, URINE  GC/CHLAMYDIA PROBE AMP ([place]) NOT AT [facility]    EKG None  Radiology No results found.  Procedures Procedures (including critical care time)  Medications Ordered in ED Medications  cefTRIAXone (ROCEPHIN) injection 500 mg (has no administration in time range)  doxycycline (VIBRA-TABS) tablet 100 mg (has no administration in time range)  valACYclovir (VALTREX) tablet 1,000 mg (has no administration in time range)  lidocaine (XYLOCAINE) 2 % jelly 1 application (has no administration in time range)  lidocaine (PF) (XYLOCAINE) 1 % injection 2 mL (has no administration in time range)    ED Course  I have reviewed the triage vital signs and the nursing notes.  Pertinent labs & imaging results that were available during my care of the patient were reviewed by me and considered in my medical decision making (see chart for details).    MDM Rules/Calculators/A&P                           Patient presents with vaginal pain. She is overall nontoxic and vital signs are reassuring. Exam shows multiple Dhungel low secretions. Highly suspicious for herpes simplex virus. Not consistent with genital warts or syphilis. Viral culture was obtained. Wet prep and GC chlamydia was also obtained by blind swab. Discussed my concern for genital herpes with the patient. This would be a primary outbreak. We discussed treatment with Valtrex, abstinence and safe sex. We'll also treat for chlamydia and gonorrhea with doxycycline and Rocephin.  After history, exam, and medical workup I feel the patient has been appropriately medically screened and is safe for discharge home. Pertinent diagnoses were discussed with the patient. Patient was given return precautions.   Final Clinical Impression(s) / ED Diagnoses Final diagnoses:  Herpes  Concern about STD in female without diagnosis    Rx  / DC Orders ED Discharge Orders         Ordered    valACYclovir (VALTREX) 1000 MG tablet  2 times daily     Discontinue  Reprint     09/15/19 2349    doxycycline (VIBRAMYCIN) 100 MG capsule  2 times daily     Discontinue  Reprint     09/15/19 2349           Shon Baton, MD 09/16/19 0005

## 2019-09-15 NOTE — ED Triage Notes (Signed)
Pt reports vaginal irritation and pain x 3 days. Denies discharge. Pt tried using Monistat One without any relief.

## 2019-09-16 NOTE — ED Notes (Signed)
Discharge instructions reviewed.  Verbalized understanding.   No s/s of reaction noted to IM injection.

## 2019-09-17 ENCOUNTER — Emergency Department (HOSPITAL_BASED_OUTPATIENT_CLINIC_OR_DEPARTMENT_OTHER)
Admission: EM | Admit: 2019-09-17 | Discharge: 2019-09-17 | Disposition: A | Discharge status: Home / Self Care | Payer: Commercial Managed Care - PPO | Source: Home / Self Care | Attending: Emergency Medicine | Admitting: Emergency Medicine

## 2019-09-17 ENCOUNTER — Encounter (HOSPITAL_BASED_OUTPATIENT_CLINIC_OR_DEPARTMENT_OTHER): Payer: Self-pay | Admitting: Emergency Medicine

## 2019-09-17 ENCOUNTER — Emergency Department (HOSPITAL_COMMUNITY)
Admission: EM | Admit: 2019-09-17 | Discharge: 2019-09-17 | Disposition: A | Discharge status: Home / Self Care | Payer: Commercial Managed Care - PPO | Attending: Emergency Medicine | Admitting: Emergency Medicine

## 2019-09-17 ENCOUNTER — Encounter (HOSPITAL_COMMUNITY): Payer: Self-pay | Admitting: *Deleted

## 2019-09-17 ENCOUNTER — Other Ambulatory Visit: Payer: Self-pay

## 2019-09-17 DIAGNOSIS — N899 Noninflammatory disorder of vagina, unspecified: Secondary | ICD-10-CM | POA: Insufficient documentation

## 2019-09-17 DIAGNOSIS — B009 Herpesviral infection, unspecified: Secondary | ICD-10-CM | POA: Insufficient documentation

## 2019-09-17 DIAGNOSIS — Z5321 Procedure and treatment not carried out due to patient leaving prior to being seen by health care provider: Secondary | ICD-10-CM | POA: Insufficient documentation

## 2019-09-17 LAB — GC/CHLAMYDIA PROBE AMP (~~LOC~~) NOT AT ARMC
Chlamydia: NEGATIVE
Comment: NEGATIVE
Comment: NORMAL
Neisseria Gonorrhea: NEGATIVE

## 2019-09-17 MED ORDER — OXYCODONE HCL 5 MG PO TABS
5.0000 mg | ORAL_TABLET | Freq: Once | ORAL | Status: AC
  Administered 2019-09-17: 5 mg via ORAL
  Filled 2019-09-17: qty 1

## 2019-09-17 MED ORDER — OXYCODONE HCL 5 MG PO TABS: 5.0000 mg | ORAL_TABLET | ORAL | 0 refills | Status: DC | PRN

## 2019-09-17 MED ORDER — LIDOCAINE HCL URETHRAL/MUCOSAL 2 % EX GEL
1.0000 "application " | Freq: Once | CUTANEOUS | Status: AC
  Administered 2019-09-17: 1 via TOPICAL
  Filled 2019-09-17: qty 20

## 2019-09-17 MED ORDER — ONDANSETRON 4 MG PO TBDP
4.0000 mg | ORAL_TABLET | Freq: Three times a day (TID) | ORAL | 0 refills | Status: DC | PRN
Start: 2019-09-17 — End: 2020-12-11

## 2019-09-17 NOTE — Discharge Instructions (Addendum)
You were evaluated in the Emergency Department and after careful evaluation, we did not find any emergent condition requiring admission or further testing in the hospital.  Your exam/testing today was overall reassuring.  We recommend Tylenol 1000 mg every 4-6 hours.  We also recommend Motrin 600 mg every 4-6 hours.  You can use the oxycodone medication as needed for more significant pain keeping you from sleeping.  Please use the Zofran medication for nausea as needed and use this medicine to take your Valtrex at home.  Please return to the Emergency Department if you experience any worsening of your condition.  We encourage you to follow up with a primary care provider.  Thank you for allowing [place] to be a part of your care.

## 2019-09-17 NOTE — ED Triage Notes (Signed)
Pt ambulatory to triage c/o severe burning in vaginal area. Recently dx with herpes, has been taking oral medication, says lidocaine gel burns and makes the pain worse. No new discharge.

## 2019-09-17 NOTE — ED Triage Notes (Signed)
Pt states she was seen last night and diagnosed with herpes  Pt states the pain is worse today  Pt states she has been taking her medication and using lidocaine but it is not helping

## 2019-09-17 NOTE — ED Provider Notes (Signed)
[facility] Emergency Department Provider Note MRN:  [id number]  Arrival date & time: 09/17/19     Chief Complaint   Vaginal Pain   History of Present Illness   Melanie Burch is a 24 y.o. year-old female with no pertinent past medical history presenting to the ED with chief complaint of vaginal pain.  Patient began developing sores in and around her vagina 6 days ago.  Was told she probably has herpes during her recent ED visit.  Has been largely unable to take her Valtrex due to p.o. intolerance, occasional vomiting.  Her pain continues to worsen, described as a burning pain to the vagina.  Denies abdominal pain, no fever, no other complaints.  No oral sores, no other rash.  Review of Systems  A complete 10 system review of systems was obtained and all systems are negative except as noted in the HPI and PMH.   Patient's Health History   History reviewed. No pertinent past medical history.  History reviewed. No pertinent surgical history.  History reviewed. No pertinent family history.  Social History   Socioeconomic History  . Marital status: Single    Spouse name: Not on file  . Number of children: Not on file  . Years of education: Not on file  . Highest education level: Not on file  Occupational History  . Not on file  Tobacco Use  . Smoking status: Never Smoker  . Smokeless tobacco: Never Used  Vaping Use  . Vaping Use: Never used  Substance and Sexual Activity  . Alcohol use: Yes    Comment: socially   . Drug use: No  . Sexual activity: Not on file  Other Topics Concern  . Not on file  Social History Narrative  . Not on file   Social Determinants of Health   Financial Resource Strain:   . Difficulty of Paying Living Expenses:   Food Insecurity:   . Worried About Programme researcher, broadcasting/film/video in the Last Year:   . Barista in the Last Year:   Transportation Needs:   . Freight forwarder (Medical):   Marland Kitchen Lack of  Transportation (Non-Medical):   Physical Activity:   . Days of Exercise per Week:   . Minutes of Exercise per Session:   Stress:   . Feeling of Stress :   Social Connections:   . Frequency of Communication with Friends and Family:   . Frequency of Social Gatherings with Friends and Family:   . Attends Religious Services:   . Active Member of Clubs or Organizations:   . Attends Banker Meetings:   Marland Kitchen Marital Status:   Intimate Partner Violence:   . Fear of Current or Ex-Partner:   . Emotionally Abused:   Marland Kitchen Physically Abused:   . Sexually Abused:      Physical Exam   Vitals:   09/17/19 0519  BP: (!) 134/109  Pulse: (!) 104  Resp: 20  Temp: 98.5 F (36.9 C)  SpO2: 100%    CONSTITUTIONAL: Well-appearing, in moderate distress due to pain NEURO:  Alert and oriented x 3, no focal deficits EYES:  eyes equal and reactive ENT/NECK:  no LAD, no JVD CARDIO: Regular rate, well-perfused, normal S1 and S2 PULM:  CTAB no wheezing or rhonchi GI/GU:  normal bowel sounds, non-distended, non-tender; external genitalia with scattered vesicular rash with a particular cluster on the right labia minora MSK/SPINE:  No gross deformities, no edema SKIN:  no rash, atraumatic  PSYCH:  Appropriate speech and behavior  *Additional and/or pertinent findings included in MDM below  Diagnostic and Interventional Summary    EKG Interpretation  Date/Time:    Ventricular Rate:    PR Interval:    QRS Duration:   QT Interval:    QTC Calculation:   R Axis:     Text Interpretation:        Labs Reviewed - No data to display  No orders to display    Medications  oxyCODONE (Oxy IR/ROXICODONE) immediate release tablet 5 mg (5 mg Oral Given 09/17/19 0605)  lidocaine (XYLOCAINE) 2 % jelly 1 application (1 application Topical Given 09/17/19 0605)     Procedures  /  Critical Care Procedures  ED Course and Medical Decision Making  I have reviewed the triage vital signs, the nursing notes,  and pertinent available records from the EMR.  Listed above are laboratory and imaging tests that I personally ordered, reviewed, and interpreted and then considered in my medical decision making (see below for details).      Suspect herpes in need of further pain management.  Unfortunate that patient has had some vomiting in the setting of significant pain and has been unable to tolerate her Valtrex at home.  Providing oxycodone, lidocaine gel, will reassess.  No signs of diffuse or disseminated infection, no signs of systemic illness, anticipate discharge with stronger pain regimen.   Elmer Sow. Pilar Plate, MD [facility] Emergency Medicine [facility] [email or web address]  Final Clinical Impressions(s) / ED Diagnoses     ICD-10-CM   1. Herpes  B00.9     ED Discharge Orders         Ordered    oxyCODONE (ROXICODONE) 5 MG immediate release tablet  Every 4 hours PRN     Discontinue  Reprint     09/17/19 0634    ondansetron (ZOFRAN ODT) 4 MG disintegrating tablet  Every 8 hours PRN     Discontinue  Reprint     09/17/19 9323           Discharge Instructions Discussed with and Provided to Patient:     Discharge Instructions     You were evaluated in the Emergency Department and after careful evaluation, we did not find any emergent condition requiring admission or further testing in the hospital.  Your exam/testing today was overall reassuring.  We recommend Tylenol 1000 mg every 4-6 hours.  We also recommend Motrin 600 mg every 4-6 hours.  You can use the oxycodone medication as needed for more significant pain keeping you from sleeping.  Please use the Zofran medication for nausea as needed and use this medicine to take your Valtrex at home.  Please return to the Emergency Department if you experience any worsening of your condition.  We encourage you to follow up with a primary care provider.  Thank you for allowing [place] to be a part of your care.        Sabas Sous, MD 09/17/19 [phone]

## 2019-09-19 LAB — HSV CULTURE AND TYPING

## 2019-11-11 ENCOUNTER — Ambulatory Visit: Payer: Commercial Managed Care - PPO | Admitting: Internal Medicine

## 2020-12-05 ENCOUNTER — Inpatient Hospital Stay (HOSPITAL_COMMUNITY)
Admission: AD | Admit: 2020-12-05 | Discharge: 2020-12-14 | DRG: 776 | Disposition: A | Discharge status: Home / Self Care | Payer: 59 | Attending: Internal Medicine | Admitting: Internal Medicine

## 2020-12-05 ENCOUNTER — Inpatient Hospital Stay (HOSPITAL_COMMUNITY): Payer: 59

## 2020-12-05 ENCOUNTER — Encounter (HOSPITAL_COMMUNITY): Payer: Self-pay | Admitting: Obstetrics and Gynecology

## 2020-12-05 DIAGNOSIS — R0602 Shortness of breath: Secondary | ICD-10-CM

## 2020-12-05 DIAGNOSIS — O900 Disruption of cesarean delivery wound: Secondary | ICD-10-CM | POA: Diagnosis present

## 2020-12-05 DIAGNOSIS — Z978 Presence of other specified devices: Secondary | ICD-10-CM

## 2020-12-05 DIAGNOSIS — O1415 Severe pre-eclampsia, complicating the puerperium: Secondary | ICD-10-CM | POA: Diagnosis not present

## 2020-12-05 DIAGNOSIS — O165 Unspecified maternal hypertension, complicating the puerperium: Secondary | ICD-10-CM | POA: Diagnosis not present

## 2020-12-05 DIAGNOSIS — O9953 Diseases of the respiratory system complicating the puerperium: Secondary | ICD-10-CM | POA: Diagnosis present

## 2020-12-05 DIAGNOSIS — R8271 Bacteriuria: Secondary | ICD-10-CM | POA: Clinically undetermined

## 2020-12-05 DIAGNOSIS — Z98891 History of uterine scar from previous surgery: Secondary | ICD-10-CM | POA: Diagnosis not present

## 2020-12-05 DIAGNOSIS — E162 Hypoglycemia, unspecified: Secondary | ICD-10-CM | POA: Diagnosis present

## 2020-12-05 DIAGNOSIS — J810 Acute pulmonary edema: Secondary | ICD-10-CM | POA: Diagnosis not present

## 2020-12-05 DIAGNOSIS — D509 Iron deficiency anemia, unspecified: Secondary | ICD-10-CM | POA: Diagnosis present

## 2020-12-05 DIAGNOSIS — R7989 Other specified abnormal findings of blood chemistry: Secondary | ICD-10-CM | POA: Diagnosis present

## 2020-12-05 DIAGNOSIS — E871 Hypo-osmolality and hyponatremia: Secondary | ICD-10-CM | POA: Diagnosis not present

## 2020-12-05 DIAGNOSIS — Z20822 Contact with and (suspected) exposure to covid-19: Secondary | ICD-10-CM | POA: Diagnosis present

## 2020-12-05 DIAGNOSIS — J80 Acute respiratory distress syndrome: Secondary | ICD-10-CM | POA: Diagnosis present

## 2020-12-05 DIAGNOSIS — O9081 Anemia of the puerperium: Secondary | ICD-10-CM | POA: Diagnosis present

## 2020-12-05 DIAGNOSIS — Z6841 Body Mass Index (BMI) 40.0 and over, adult: Secondary | ICD-10-CM

## 2020-12-05 DIAGNOSIS — N6459 Other signs and symptoms in breast: Secondary | ICD-10-CM | POA: Diagnosis not present

## 2020-12-05 DIAGNOSIS — O99284 Endocrine, nutritional and metabolic diseases complicating childbirth: Secondary | ICD-10-CM | POA: Diagnosis present

## 2020-12-05 DIAGNOSIS — M7989 Other specified soft tissue disorders: Secondary | ICD-10-CM | POA: Diagnosis not present

## 2020-12-05 DIAGNOSIS — R0902 Hypoxemia: Secondary | ICD-10-CM

## 2020-12-05 DIAGNOSIS — J9601 Acute respiratory failure with hypoxia: Secondary | ICD-10-CM | POA: Diagnosis not present

## 2020-12-05 DIAGNOSIS — J9690 Respiratory failure, unspecified, unspecified whether with hypoxia or hypercapnia: Secondary | ICD-10-CM

## 2020-12-05 DIAGNOSIS — O1413 Severe pre-eclampsia, third trimester: Secondary | ICD-10-CM | POA: Diagnosis not present

## 2020-12-05 DIAGNOSIS — Z452 Encounter for adjustment and management of vascular access device: Secondary | ICD-10-CM

## 2020-12-05 DIAGNOSIS — E876 Hypokalemia: Secondary | ICD-10-CM | POA: Diagnosis not present

## 2020-12-05 DIAGNOSIS — O99210 Obesity complicating pregnancy, unspecified trimester: Secondary | ICD-10-CM | POA: Insufficient documentation

## 2020-12-05 DIAGNOSIS — Z79899 Other long term (current) drug therapy: Secondary | ICD-10-CM

## 2020-12-05 DIAGNOSIS — Z0189 Encounter for other specified special examinations: Secondary | ICD-10-CM

## 2020-12-05 DIAGNOSIS — Z01818 Encounter for other preprocedural examination: Secondary | ICD-10-CM

## 2020-12-05 DIAGNOSIS — O411231 Chorioamnionitis, third trimester, fetus 1: Secondary | ICD-10-CM | POA: Diagnosis not present

## 2020-12-05 DIAGNOSIS — O1410 Severe pre-eclampsia, unspecified trimester: Secondary | ICD-10-CM | POA: Diagnosis present

## 2020-12-05 HISTORY — DX: Bacteriuria: R82.71

## 2020-12-05 LAB — COMPREHENSIVE METABOLIC PANEL
ALT: 20 U/L (ref 0–44)
ALT: 21 U/L (ref 0–44)
AST: 17 U/L (ref 15–41)
AST: 24 U/L (ref 15–41)
Albumin: 2.7 g/dL — ABNORMAL LOW (ref 3.5–5.0)
Albumin: 2.7 g/dL — ABNORMAL LOW (ref 3.5–5.0)
Alkaline Phosphatase: 132 U/L — ABNORMAL HIGH (ref 38–126)
Alkaline Phosphatase: 128 U/L — ABNORMAL HIGH (ref 38–126)
Anion gap: 7 (ref 5–15)
Anion gap: 8 (ref 5–15)
BUN: 7 mg/dL (ref 6–20)
BUN: 6 mg/dL (ref 6–20)
CO2: 22 mmol/L (ref 22–32)
CO2: 23 mmol/L (ref 22–32)
Calcium: 8.8 mg/dL — ABNORMAL LOW (ref 8.9–10.3)
Calcium: 8.3 mg/dL — ABNORMAL LOW (ref 8.9–10.3)
Chloride: 111 mmol/L (ref 98–111)
Chloride: 102 mmol/L (ref 98–111)
Creatinine, Ser: 0.61 mg/dL (ref 0.44–1.00)
Creatinine, Ser: 0.71 mg/dL (ref 0.44–1.00)
GFR, Estimated: >60 mL/min (ref >60)
GFR, Estimated: >60 mL/min (ref >60)
Glucose, Bld: 96 mg/dL (ref 70–99)
Glucose, Bld: 127 mg/dL — ABNORMAL HIGH (ref 70–99)
Potassium: 3.9 mmol/L (ref 3.5–5.1)
Potassium: 3 mmol/L — ABNORMAL LOW (ref 3.5–5.1)
Sodium: 140 mmol/L (ref 135–145)
Sodium: 133 mmol/L — ABNORMAL LOW (ref 135–145)
Total Bilirubin: 0.6 mg/dL (ref 0.3–1.2)
Total Bilirubin: 0.4 mg/dL (ref 0.3–1.2)
Total Protein: 6.8 g/dL (ref 6.5–8.1)
Total Protein: 6.8 g/dL (ref 6.5–8.1)

## 2020-12-05 LAB — CBC
HCT: 32 % — ABNORMAL LOW (ref 36.0–46.0)
HCT: 32 % — ABNORMAL LOW (ref 36.0–46.0)
Hemoglobin: 10.1 g/dL — ABNORMAL LOW (ref 12.0–15.0)
Hemoglobin: 10.1 g/dL — ABNORMAL LOW (ref 12.0–15.0)
MCH: 27.5 pg (ref 26.0–34.0)
MCH: 27.3 pg (ref 26.0–34.0)
MCHC: 31.6 g/dL (ref 30.0–36.0)
MCHC: 31.6 g/dL (ref 30.0–36.0)
MCV: 87.2 fL (ref 80.0–100.0)
MCV: 86.5 fL (ref 80.0–100.0)
Platelets: 463 10*3/uL — ABNORMAL HIGH (ref 150–400)
Platelets: 466 10*3/uL — ABNORMAL HIGH (ref 150–400)
RBC: 3.67 MIL/uL — ABNORMAL LOW (ref 3.87–5.11)
RBC: 3.7 MIL/uL — ABNORMAL LOW (ref 3.87–5.11)
RDW: 13.7 % (ref 11.5–15.5)
RDW: 13.6 % (ref 11.5–15.5)
WBC: 10.5 10*3/uL (ref 4.0–10.5)
WBC: 15.7 10*3/uL — ABNORMAL HIGH (ref 4.0–10.5)
nRBC: 0 % (ref 0.0–0.2)
nRBC: 0 % (ref 0.0–0.2)

## 2020-12-05 LAB — BLOOD GAS, ARTERIAL
Acid-base deficit: 3 mmol/L — ABNORMAL HIGH (ref 0.0–2.0)
Bicarbonate: 20.7 mmol/L (ref 20.0–28.0)
Drawn by: 33098
FIO2: 1
O2 Content: 6 L/min
O2 Saturation: 97 %
pCO2 arterial: 34.3 mmHg (ref 32.0–48.0)
pH, Arterial: 7.399 (ref 7.350–7.450)
pO2, Arterial: 87.9 mmHg (ref 83.0–108.0)

## 2020-12-05 LAB — TYPE AND SCREEN
ABO/RH(D): B POS
Antibody Screen: NEGATIVE

## 2020-12-05 LAB — RESP PANEL BY RT-PCR (FLU A&B, COVID) ARPGX2
Influenza A by PCR: NEGATIVE
Influenza B by PCR: NEGATIVE
SARS Coronavirus 2 by RT PCR: NEGATIVE

## 2020-12-05 LAB — ECHOCARDIOGRAM COMPLETE
Area-P 1/2: 4.39 cm2
S' Lateral: 3.2 cm

## 2020-12-05 LAB — MAGNESIUM: Magnesium: 3 mg/dL — ABNORMAL HIGH (ref 1.7–2.4)

## 2020-12-05 LAB — LACTIC ACID, PLASMA: Lactic Acid, Venous: 1 mmol/L (ref 0.5–1.9)

## 2020-12-05 LAB — BRAIN NATRIURETIC PEPTIDE: B Natriuretic Peptide: 219.9 pg/mL — ABNORMAL HIGH (ref 0.0–100.0)

## 2020-12-05 LAB — TROPONIN I (HIGH SENSITIVITY): Troponin I (High Sensitivity): 9 ng/L (ref <18)

## 2020-12-05 MED ORDER — LABETALOL HCL 5 MG/ML IV SOLN: 40.0000 mg | INTRAVENOUS | Status: DC | PRN

## 2020-12-05 MED ORDER — FUROSEMIDE 10 MG/ML IJ SOLN
20.0000 mg | Freq: Once | INTRAMUSCULAR | Status: AC
  Administered 2020-12-05: 20 mg via INTRAVENOUS
  Filled 2020-12-05: qty 2

## 2020-12-05 MED ORDER — HYDRALAZINE HCL 20 MG/ML IJ SOLN: 5.0000 mg | INTRAMUSCULAR | Status: DC | PRN

## 2020-12-05 MED ORDER — LABETALOL HCL 5 MG/ML IV SOLN: 20.0000 mg | INTRAVENOUS | Status: DC | PRN

## 2020-12-05 MED ORDER — ACETAMINOPHEN 500 MG PO TABS
1000.0000 mg | ORAL_TABLET | Freq: Once | ORAL | Status: AC
  Administered 2020-12-05: 1000 mg via ORAL
  Filled 2020-12-05: qty 2

## 2020-12-05 MED ORDER — IBUPROFEN 600 MG PO TABS
600.0000 mg | ORAL_TABLET | Freq: Four times a day (QID) | ORAL | Status: DC | PRN
  Administered 2020-12-05: 600 mg via ORAL
  Filled 2020-12-05: qty 1

## 2020-12-05 MED ORDER — METOCLOPRAMIDE HCL 10 MG PO TABS
10.0000 mg | ORAL_TABLET | Freq: Once | ORAL | Status: AC
  Administered 2020-12-05: 10 mg via ORAL
  Filled 2020-12-05: qty 1

## 2020-12-05 MED ORDER — MAGNESIUM SULFATE BOLUS VIA INFUSION
4.0000 g | Freq: Once | INTRAVENOUS | Status: AC
  Administered 2020-12-05: 4 g via INTRAVENOUS
  Filled 2020-12-05: qty 1000

## 2020-12-05 MED ORDER — PRENATAL MULTIVITAMIN CH
1.0000 | ORAL_TABLET | Freq: Every day | ORAL | Status: DC
  Administered 2020-12-06: 1 via ORAL
  Filled 2020-12-05 (×3): qty 1

## 2020-12-05 MED ORDER — FAMOTIDINE 20 MG PO TABS: 20.0000 mg | ORAL_TABLET | Freq: Two times a day (BID) | ORAL | Status: DC | PRN

## 2020-12-05 MED ORDER — DOCUSATE SODIUM 100 MG PO CAPS: 100.0000 mg | ORAL_CAPSULE | Freq: Two times a day (BID) | ORAL | Status: DC | PRN

## 2020-12-05 MED ORDER — HYDRALAZINE HCL 20 MG/ML IJ SOLN: 10.0000 mg | INTRAMUSCULAR | Status: DC | PRN

## 2020-12-05 MED ORDER — GUAIFENESIN-CODEINE 100-10 MG/5ML PO SOLN
10.0000 mL | ORAL | Status: DC | PRN
  Administered 2020-12-05 – 2020-12-07 (×7): 10 mL via ORAL
  Filled 2020-12-05 (×7): qty 10

## 2020-12-05 MED ORDER — ONDANSETRON HCL 4 MG/2ML IJ SOLN: 4.0000 mg | Freq: Three times a day (TID) | INTRAMUSCULAR | Status: DC | PRN

## 2020-12-05 MED ORDER — MAGNESIUM SULFATE 40 GM/1000ML IV SOLN
2.0000 g/h | INTRAVENOUS | Status: DC
  Administered 2020-12-05: 2 g/h via INTRAVENOUS
  Filled 2020-12-05: qty 1000

## 2020-12-05 MED ORDER — MAGNESIUM SULFATE 40 GM/1000ML IV SOLN: 2.0000 g/h | INTRAVENOUS | Status: DC

## 2020-12-05 MED ORDER — ENOXAPARIN SODIUM 60 MG/0.6ML IJ SOSY
60.0000 mg | PREFILLED_SYRINGE | INTRAMUSCULAR | Status: DC
  Administered 2020-12-05 – 2020-12-13 (×9): 60 mg via SUBCUTANEOUS
  Filled 2020-12-05 (×10): qty 0.6

## 2020-12-05 MED ORDER — LACTATED RINGERS IV SOLN: INTRAVENOUS | Status: DC

## 2020-12-05 MED ORDER — IOHEXOL 350 MG/ML SOLN
80.0000 mL | Freq: Once | INTRAVENOUS | Status: AC | PRN
  Administered 2020-12-05: 80 mL via INTRAVENOUS

## 2020-12-05 MED ORDER — OXYCODONE HCL 5 MG PO TABS
5.0000 mg | ORAL_TABLET | Freq: Four times a day (QID) | ORAL | Status: DC | PRN
Start: 2020-12-05 — End: 2020-12-07

## 2020-12-05 MED ORDER — CALCIUM CARBONATE ANTACID 500 MG PO CHEW: 2.0000 | CHEWABLE_TABLET | ORAL | Status: DC | PRN

## 2020-12-05 MED ORDER — ACETAMINOPHEN 325 MG PO TABS
650.0000 mg | ORAL_TABLET | ORAL | Status: DC | PRN
Start: 2020-12-05 — End: 2020-12-07
  Administered 2020-12-06 – 2020-12-07 (×3): 650 mg via ORAL
  Filled 2020-12-05 (×4): qty 2

## 2020-12-05 NOTE — Progress Notes (Signed)
OB Note Patient with increased RR, just voided UOP. Labs to be drawn soon. RR increased since being on Mg. Will d/c Mg and MIVF and just do SLIV and another of lasix 20mg  IV ordered.  MD Attending [facility] (Faculty Practice) 12/05/2020 Time: 2219

## 2020-12-05 NOTE — Progress Notes (Signed)
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 EF normal. Covid/flu neg.   eICU Interventions  - ok for lasix trial, if edema, should resolve faster. If gets fever, tachy to consider treating as Pneumonia. Can consider to get a procalcitonin level.      Intervention Category Intermediate Interventions: Communication with other healthcare providers and/or family  Ranee Gosselin 12/05/2020, 8:39 PM

## 2020-12-05 NOTE — MAU Provider Note (Signed)
History     CSN: [id number]  Arrival date and time: 12/05/20 1558   Event Date/Time   First Provider Initiated Contact with Patient 12/05/20 1611      Chief Complaint  Patient presents with   Shortness of Breath   Chest Pain   HPI Melanie Burch is a 25 y.o. G1P1001 postpartum from a primary c/s in [place] on 9/15 who presents via EMS with shortness of breath. She states she woke up this morning and felt like she couldn't breathe and was having tightness in her chest. She also reports a cough. She denies any other complaints. She denies any HA, visual changes or epigastric pain.   She reports an uncomplicated, low risk pregnancy. She was induced at 41 weeks, made it to 7cm and was delivered by c/s due to arrest of dilation and Triple I. She denies any complications with the delivery. Per review of the chart and delivery record, there were no complications.   On day 5 postpartum, she reports that she lost feeling in her right leg and was sent for an ultrasound which did not show a DVT. She reports she now has normal sensation in her legs. She reports more swelling in her lower extremities.    On arrival from EMS, she was on 6L O2 by nasal cannula and saturations were still in the low 90s.   OB History     Gravida  1   Para  1   Term  1   Preterm  0   AB  0   Living  1      SAB  0   IAB  0   Ectopic  0   Multiple  0   Live Births  0           History reviewed. No pertinent past medical history.  Past Surgical History:  Procedure Laterality Date   CESAREAN SECTION      History reviewed. No pertinent family history.  Social History   Tobacco Use   Smoking status: Never   Smokeless tobacco: Never  Vaping Use   Vaping Use: Never used  Substance Use Topics   Alcohol use: Yes    Comment: socially    Drug use: No    Allergies: No Known Allergies  Medications Prior to Admission  Medication Sig Dispense Refill Last Dose   ibuprofen  (ADVIL) 800 MG tablet Take 800 mg by mouth every 8 (eight) hours as needed.    at 1400   doxycycline (VIBRAMYCIN) 100 MG capsule Take 1 capsule (100 mg total) by mouth 2 (two) times daily. 20 capsule 0    ondansetron (ZOFRAN ODT) 4 MG disintegrating tablet Take 1 tablet (4 mg total) by mouth every 8 (eight) hours as needed for nausea or vomiting. 20 tablet 0    oxyCODONE (ROXICODONE) 5 MG immediate release tablet Take 1 tablet (5 mg total) by mouth every 4 (four) hours as needed for severe pain. 8 tablet 0    valACYclovir (VALTREX) 1000 MG tablet Take 1 tablet (1,000 mg total) by mouth 2 (two) times daily. 10 tablet 0     Review of Systems  Constitutional: Negative.  Negative for fatigue and fever.  HENT: Negative.    Respiratory:  Positive for cough, chest tightness and shortness of breath.   Cardiovascular: Negative.  Negative for chest pain.  Gastrointestinal: Negative.  Negative for abdominal pain, constipation, diarrhea, nausea and vomiting.  Genitourinary: Negative.  Negative for dysuria.  Neurological: Negative.  Negative for dizziness and headaches.  Physical Exam   Blood pressure 138/66, pulse 84, temperature 98.3 F (36.8 C), temperature source Oral, resp. rate 20, SpO2 93 %.  Patient Vitals for the past 24 hrs:  BP Temp Temp src Pulse Resp SpO2  12/05/20 1844 138/66 -- -- 84 -- 93 %  12/05/20 1820 125/82 -- -- 78 -- 98 %  12/05/20 1810 126/70 -- -- 65 -- 100 %  12/05/20 1800 135/68 -- -- 62 -- 99 %  12/05/20 1755 133/72 -- -- 71 -- --  12/05/20 1740 135/71 -- -- 63 -- 98 %  12/05/20 1730 140/69 -- -- 75 -- 98 %  12/05/20 1720 133/72 -- -- 67 -- 98 %  12/05/20 1710 135/69 -- -- 68 -- 98 %  12/05/20 1700 (!) 143/81 -- -- 68 -- 98 %  12/05/20 1650 139/78 -- -- 71 -- 98 %  12/05/20 1640 134/74 -- -- 66 -- 98 %  12/05/20 1630 135/72 -- -- 74 -- 98 %  12/05/20 1605 (!) 150/83 -- -- 82 20 94 %  12/05/20 1603 (!) 154/74 98.3 F (36.8 C) Oral 74 18 (!) 72 %   Physical  Exam Vitals and nursing note reviewed.  Constitutional:      General: She is in acute distress.     Appearance: She is well-developed. She is ill-appearing.  HENT:     Head: Normocephalic.  Eyes:     Pupils: Pupils are equal, round, and reactive to light.  Cardiovascular:     Rate and Rhythm: Normal rate and regular rhythm.     Heart sounds: Normal heart sounds.  Pulmonary:     Effort: Pulmonary effort is normal. No respiratory distress.     Breath sounds: Examination of the right-middle field reveals decreased breath sounds and rhonchi. Examination of the right-lower field reveals decreased breath sounds and rhonchi. Decreased breath sounds and rhonchi present.  Abdominal:     General: Bowel sounds are normal. There is no distension.     Palpations: Abdomen is soft.     Tenderness: There is no abdominal tenderness.  Skin:    General: Skin is warm and dry.  Neurological:     Mental Status: She is alert and oriented to person, place, and time.  Psychiatric:        Mood and Affect: Mood normal.        Behavior: Behavior normal.        Thought Content: Thought content normal.        Judgment: Judgment normal.    MAU Course  Procedures Results for orders placed or performed during the hospital encounter of 12/05/20 (from the past 24 hour(s))  Lactic acid, plasma     Status: None   Collection Time: 12/05/20  4:16 PM  Result Value Ref Range   Lactic Acid, Venous 1.0 0.5 - 1.9 mmol/L  Resp Panel by RT-PCR (Flu A&B, Covid) Nasopharyngeal Swab     Status: None   Collection Time: 12/05/20  4:18 PM   Specimen: Nasopharyngeal Swab; Nasopharyngeal(NP) swabs in vial transport medium  Result Value Ref Range   SARS Coronavirus 2 by RT PCR NEGATIVE NEGATIVE   Influenza A by PCR NEGATIVE NEGATIVE   Influenza B by PCR NEGATIVE NEGATIVE  CBC     Status: Abnormal   Collection Time: 12/05/20  4:23 PM  Result Value Ref Range   WBC 10.5 4.0 - 10.5 K/uL   RBC 3.67 (L) 3.87 - 5.11 MIL/uL  Hemoglobin 10.1 (L) 12.0 - 15.0 g/dL   HCT 40.9 (L) 81.1 - 91.4 %   MCV 87.2 80.0 - 100.0 fL   MCH 27.5 26.0 - 34.0 pg   MCHC 31.6 30.0 - 36.0 g/dL   RDW 78.2 95.6 - 21.3 %   Platelets 463 (H) 150 - 400 K/uL   nRBC 0.0 0.0 - 0.2 %  Comprehensive metabolic panel     Status: Abnormal   Collection Time: 12/05/20  4:23 PM  Result Value Ref Range   Sodium 140 135 - 145 mmol/L   Potassium 3.9 3.5 - 5.1 mmol/L   Chloride 111 98 - 111 mmol/L   CO2 22 22 - 32 mmol/L   Glucose, Bld 96 70 - 99 mg/dL   BUN 7 6 - 20 mg/dL   Creatinine, Ser 0.86 0.44 - 1.00 mg/dL   Calcium 8.8 (L) 8.9 - 10.3 mg/dL   Total Protein 6.8 6.5 - 8.1 g/dL   Albumin 2.7 (L) 3.5 - 5.0 g/dL   AST 17 15 - 41 U/L   ALT 20 0 - 44 U/L   Alkaline Phosphatase 132 (H) 38 - 126 U/L   Total Bilirubin 0.6 0.3 - 1.2 mg/dL   GFR, Estimated >57 >84 mL/min   Anion gap 7 5 - 15  Troponin I (High Sensitivity)     Status: None   Collection Time: 12/05/20  4:23 PM  Result Value Ref Range   Troponin I (High Sensitivity) 9 <18 ng/L  Brain natriuretic peptide     Status: Abnormal   Collection Time: 12/05/20  4:23 PM  Result Value Ref Range   B Natriuretic Peptide 219.9 (H) 0.0 - 100.0 pg/mL  Type and screen [facility]     Status: None   Collection Time: 12/05/20  4:24 PM  Result Value Ref Range   ABO/RH(D) B POS    Antibody Screen NEG    Sample Expiration      12/08/2020,2359 Performed at [facility], [address], [place], [place] [address]   Blood gas, arterial     Status: Abnormal   Collection Time: 12/05/20  6:18 PM  Result Value Ref Range   FIO2 1.00    O2 Content 6.0 L/min   Delivery systems NASAL CANNULA    pH, Arterial 7.399 7.350 - 7.450   pCO2 arterial 34.3 32.0 - 48.0 mmHg   pO2, Arterial 87.9 83.0 - 108.0 mmHg   Bicarbonate 20.7 20.0 - 28.0 mmol/L   Acid-base deficit 3.0 (H) 0.0 - 2.0 mmol/L   O2 Saturation 97.0 %   Collection site LEFT RADIAL    Drawn by [phone]    Sample type  ARTERIAL    Allens test (pass/fail) PASS PASS    CT Angio Chest PE W and/or Wo Contrast  Result Date: 12/05/2020 CLINICAL DATA:  Shortness of breath, chest pain. Normal white blood cell count. Elevated brain natriuretic peptide. EXAM: CT ANGIOGRAPHY CHEST WITH CONTRAST TECHNIQUE: Multidetector CT imaging of the chest was performed using the standard protocol during bolus administration of intravenous contrast. Multiplanar CT image reconstructions and MIPs were obtained to evaluate the vascular anatomy. CONTRAST:  80mL OMNIPAQUE IOHEXOL 350 MG/ML SOLN COMPARISON:  X-ray 12/05/2020 FINDINGS: Cardiovascular: Satisfactory opacification of the pulmonary arteries to the segmental level. No evidence of pulmonary embolism. Thoracic aorta is normal in course and caliber. Normal heart size. No pericardial effusion. Mediastinum/Nodes: No enlarged mediastinal, hilar, or axillary lymph nodes. Thyroid gland, trachea, and esophagus demonstrate no  significant findings. Lungs/Pleura: Extensive dense airspace consolidations and surrounding ground-glass opacity throughout both lungs in a predominantly perihilar and bibasilar distribution. Trace bilateral pleural effusions. No pneumothorax. Upper Abdomen: No acute abnormality. Musculoskeletal: No chest wall abnormality. No acute or significant osseous findings. Review of the MIP images confirms the above findings. IMPRESSION: 1. No evidence of pulmonary embolism. 2. Extensive dense airspace consolidations and surrounding ground-glass opacity throughout both lungs in a predominantly perihilar and bibasilar distribution. Findings could reflect pulmonary edema versus multifocal pneumonia. 3. Trace bilateral pleural effusions. Electronically Signed   By: Duanne Guess D.O.   On: 12/05/2020 19:09   DG CHEST PORT 1 VIEW  Result Date: 12/05/2020 CLINICAL DATA:  Shortness of breath EXAM: PORTABLE CHEST 1 VIEW COMPARISON:  02/14/2011 FINDINGS: Low lung volumes. Borderline to mild  cardiomegaly. Perihilar and lower lung airspace disease. No definitive effusion. No pneumothorax. IMPRESSION: Low lung volumes. Borderline cardiomegaly with moderate bilateral mid to lower lung airspace disease, favor pneumonia over edema. Electronically Signed   By: Jasmine Pang M.D.   On: 12/05/2020 16:38   ECHOCARDIOGRAM COMPLETE  Result Date: 12/05/2020    ECHOCARDIOGRAM REPORT   Patient Name:   Melanie Burch Date of Exam: 12/05/2020 Medical Rec #:  [id number]           Height:       64.0 in Accession #:    [id number]          Weight:       270.0 lb Date of Birth:  November 22, 1995           BSA:          2.223 m Patient Age:    25 years            BP:           135/69 mmHg Patient Gender: F                   HR:           65 bpm. Exam Location:  Inpatient Procedure: 2D Echo STAT ECHO Indications:    shortness of breath  History:        Patient has no prior history of Echocardiogram examinations.                 Signs/Symptoms:postpartum complications.  Sonographer:    Delcie Roch RDCS Referring Phys: [phone] Gigi Onstad M Erica Richwine IMPRESSIONS  1. Left ventricular ejection fraction, by estimation, is 60 to 65%. The left ventricle has normal function. The left ventricle has no regional wall motion abnormalities. Left ventricular diastolic parameters were normal.  2. Right ventricular systolic function is normal. The right ventricular size is normal. There is mildly elevated pulmonary artery systolic pressure.  3. The mitral valve is normal in structure. Mild mitral valve regurgitation. No evidence of mitral stenosis.  4. The tricuspid valve is abnormal.  5. The aortic valve is tricuspid. Aortic valve regurgitation is not visualized. No aortic stenosis is present.  6. The inferior vena cava is dilated in size with >50% respiratory variability, suggesting right atrial pressure of 8 mmHg. FINDINGS  Left Ventricle: Left ventricular ejection fraction, by estimation, is 60 to 65%. The left ventricle has normal  function. The left ventricle has no regional wall motion abnormalities. The left ventricular internal cavity size was normal in size. There is  no left ventricular hypertrophy. Left ventricular diastolic parameters were normal. Right Ventricle: The right ventricular size is normal. No increase in right ventricular wall  thickness. Right ventricular systolic function is normal. There is mildly elevated pulmonary artery systolic pressure. The tricuspid regurgitant velocity is 2.93  m/s, and with an assumed right atrial pressure of 8 mmHg, the estimated right ventricular systolic pressure is 42.3 mmHg. Left Atrium: Left atrial size was normal in size. Right Atrium: Right atrial size was normal in size. Pericardium: There is no evidence of pericardial effusion. Mitral Valve: The mitral valve is normal in structure. Mild mitral valve regurgitation. No evidence of mitral valve stenosis. Tricuspid Valve: The tricuspid valve is abnormal. Tricuspid valve regurgitation is mild . No evidence of tricuspid stenosis. Aortic Valve: The aortic valve is tricuspid. Aortic valve regurgitation is not visualized. No aortic stenosis is present. Pulmonic Valve: The pulmonic valve was not well visualized. Pulmonic valve regurgitation is trivial. No evidence of pulmonic stenosis. Aorta: The aortic root is normal in size and structure. Venous: The inferior vena cava is dilated in size with greater than 50% respiratory variability, suggesting right atrial pressure of 8 mmHg. IAS/Shunts: The interatrial septum was not well visualized.  LEFT VENTRICLE PLAX 2D LVIDd:         4.90 cm  Diastology LVIDs:         3.20 cm  LV e' medial:    11.30 cm/s LV PW:         1.10 cm  LV E/e' medial:  10.3 LV IVS:        1.00 cm  LV e' lateral:   13.30 cm/s LVOT diam:     2.00 cm  LV E/e' lateral: 8.7 LV SV:         72 LV SV Index:   32 LVOT Area:     3.14 cm  RIGHT VENTRICLE             IVC RV S prime:     14.10 cm/s  IVC diam: 2.40 cm TAPSE (M-mode): 2.4 cm  LEFT ATRIUM             Index       RIGHT ATRIUM           Index LA diam:        4.20 cm 1.89 cm/m  RA Area:     16.20 cm LA Vol (A2C):   75.7 ml 34.06 ml/m RA Volume:   42.50 ml  19.12 ml/m LA Vol (A4C):   56.2 ml 25.29 ml/m LA Biplane Vol: 67.0 ml 30.15 ml/m  AORTIC VALVE LVOT Vmax:   116.00 cm/s LVOT Vmean:  76.500 cm/s LVOT VTI:    0.228 m  AORTA Ao Root diam: 3.10 cm Ao Asc diam:  3.10 cm MITRAL VALVE                TRICUSPID VALVE MV Area (PHT): 4.39 cm     TR Peak grad:   34.3 mmHg MV Decel Time: 173 msec     TR Vmax:        293.00 cm/s MV E velocity: 116.00 cm/s MV A velocity: 90.60 cm/s   SHUNTS MV E/A ratio:  1.28         Systemic VTI:  0.23 m                             Systemic Diam: 2.00 cm Dina Rich MD Electronically signed by Dina Rich MD Signature Date/Time: 12/05/2020/5:40:11 PM    Final       MDM CBC, CMP Troponin, BNP,  Lactic acid COVID test Blood gas Chest Xray CT Angio Chest PE ED EKG- normal sinus rhythm  Echocardiogram  Patient arrived with O2 saturations at 70-72% without oxygen. Nasal cannula replaced and O2 increased to 7L to get patient at 97% consistently.  Dr. Vergie Living notified of patient arrival and acuity.   During the evaluation, patient developed a headache she rates a 5/10. Tylenol and Reglan PO- patient reports resolution of headache  Dr. Vergie Living notified of all results. Notified that patient still has significant difficulty breathing with exertion and still requiring 6L O2 by nasal cannula to maintain her O2 saturations. Will come see patient to discuss admission.  Assessment and Plan  - Postpartum preeclampsia  -Admit OBSC for magnesium -Care turned over to MD  Rolm Bookbinder CNM 12/05/2020, 7:20 PM

## 2020-12-05 NOTE — Progress Notes (Signed)
  Echocardiogram 2D Echocardiogram has been performed.  Melanie Burch 12/05/2020, 5:18 PM

## 2020-12-05 NOTE — H&P (Signed)
Attestation signed by [place] Bing, MD at 12/05/2020  7:41 PM   Attestation of Attending Supervision of Certified Nurse Midwife: Evaluation and management procedures were performed by the CNM under my supervision.  I have seen and examined the patient,  reviewed the CNM's note and chart, and I agree with the management and plan.    CT PE negative for PE, echo negative; clinical picture likely severe pre-eclampsia with pulmonary edema and not a PNA. S/p lasix 20 IV x 1 and will start Mg x 24 hours and rpt labs q6h and watch I/O and keep patient dry.   Incision with some overlapping and draining sero-sang fluid from the left lateral edge. No e/o infection, ?seroma draining. Continue to follow.    Cornelia Copa MD Attending Center for [facility] (Faculty Practice)            Expand All Collapse All                                                                                                                                                                                                                                                                                                                                                                                                                                                         History    CSN: [id number]  Arrival date and time: 12/05/20 1558    Event Date/Time   First Provider Initiated Contact with Patient 12/05/20 1611          Chief Complaint  Patient presents with   Shortness of Breath   Chest Pain    HPI Melanie Burch is a 25 y.o. G1P1001 postpartum from a primary c/s in [place] on 9/15 who presents via EMS with shortness of breath. She states she woke up this morning and felt like she couldn't breathe and was having tightness in her chest. She also reports a  cough. She denies any other complaints. She denies any HA, visual changes or epigastric pain.    She reports an uncomplicated, low risk pregnancy. She was induced at 41 weeks, made it to 7cm and was delivered by c/s due to arrest of dilation and Triple I. She denies any complications with the delivery. Per review of the chart and delivery record, there were no complications.    On day 5 postpartum, she reports that she lost feeling in her right leg and was sent for an ultrasound which did not show a DVT. She reports she now has normal sensation in her legs. She reports more swelling in her lower extremities.      On arrival from EMS, she was on 6L O2 by nasal cannula and saturations were still in the low 90s.    OB History       Gravida  1   Para  1   Term  1   Preterm  0   AB  0   Living  1        SAB  0   IAB  0   Ectopic  0   Multiple  0   Live Births  0               History reviewed. No pertinent past medical history.        Past Surgical History:  Procedure Laterality Date   CESAREAN SECTION          History reviewed. No pertinent family history.   Social History         Tobacco Use   Smoking status: Never   Smokeless tobacco: Never  Vaping Use   Vaping Use: Never used  Substance Use Topics   Alcohol use: Yes      Comment: socially    Drug use: No      Allergies: No Known Allergies          Medications Prior to Admission  Medication Sig Dispense Refill Last Dose   ibuprofen (ADVIL) 800 MG tablet Take 800 mg by mouth every 8 (eight) hours as needed.      at 1400   doxycycline (VIBRAMYCIN) 100 MG capsule Take 1 capsule (100 mg total) by mouth 2 (two) times daily. 20 capsule 0     ondansetron (ZOFRAN ODT) 4 MG disintegrating tablet Take 1 tablet (4 mg total) by mouth every 8 (eight) hours as needed for nausea or vomiting. 20 tablet 0     oxyCODONE (ROXICODONE) 5 MG immediate release tablet Take 1 tablet (5 mg total) by mouth every 4 (four)  hours as needed for severe pain. 8 tablet 0     valACYclovir (VALTREX) 1000 MG tablet Take 1 tablet (1,000 mg total) by mouth 2 (two) times daily. 10 tablet 0        Review of Systems  Constitutional: Negative.  Negative for fatigue and fever.  HENT: Negative.    Respiratory:  Positive for cough, chest tightness and shortness of breath.   Cardiovascular: Negative.  Negative for chest pain.  Gastrointestinal: Negative.  Negative for abdominal pain, constipation, diarrhea, nausea and vomiting.  Genitourinary: Negative.  Negative for dysuria.  Neurological: Negative.  Negative for dizziness and headaches.  Physical Exam    Blood pressure 138/66, pulse 84, temperature 98.3 F (36.8 C), temperature source Oral, resp. rate 20, SpO2 93 %.   Patient Vitals for the past 24 hrs:   BP Temp Temp src Pulse Resp SpO2  12/05/20 1844 138/66 -- -- 84 -- 93 %  12/05/20 1820 125/82 -- -- 78 -- 98 %  12/05/20 1810 126/70 -- -- 65 -- 100 %  12/05/20 1800 135/68 -- -- 62 -- 99 %  12/05/20 1755 133/72 -- -- 71 -- --  12/05/20 1740 135/71 -- -- 63 -- 98 %  12/05/20 1730 140/69 -- -- 75 -- 98 %  12/05/20 1720 133/72 -- -- 67 -- 98 %  12/05/20 1710 135/69 -- -- 68 -- 98 %  12/05/20 1700 (!) 143/81 -- -- 68 -- 98 %  12/05/20 1650 139/78 -- -- 71 -- 98 %  12/05/20 1640 134/74 -- -- 66 -- 98 %  12/05/20 1630 135/72 -- -- 74 -- 98 %  12/05/20 1605 (!) 150/83 -- -- 82 20 94 %  12/05/20 1603 (!) 154/74 98.3 F (36.8 C) Oral 74 18 (!) 72 %    Physical Exam Vitals and nursing note reviewed.  Constitutional:      General: She is in acute distress.     Appearance: She is well-developed. She is ill-appearing.  HENT:     Head: Normocephalic.  Eyes:     Pupils: Pupils are equal, round, and reactive to light.  Cardiovascular:     Rate and Rhythm: Normal rate and regular rhythm.     Heart sounds: Normal heart sounds.  Pulmonary:     Effort: Pulmonary effort is normal. No respiratory distress.     Breath  sounds: Examination of the right-middle field reveals decreased breath sounds and rhonchi. Examination of the right-lower field reveals decreased breath sounds and rhonchi. Decreased breath sounds and rhonchi present.  Abdominal:     General: Bowel sounds are normal. There is no distension.     Palpations: Abdomen is soft.     Tenderness: There is no abdominal tenderness.  Skin:    General: Skin is warm and dry.  Neurological:     Mental Status: She is alert and oriented to person, place, and time.  Psychiatric:        Mood and Affect: Mood normal.        Behavior: Behavior normal.        Thought Content: Thought content normal.        Judgment: Judgment normal.      MAU Course  Procedures Lab Results Last 24 Hours        Results for orders placed or performed during the hospital encounter of 12/05/20 (from the past 24 hour(s))  Lactic acid, plasma     Status: None    Collection Time: 12/05/20  4:16 PM  Result Value Ref Range    Lactic Acid, Venous 1.0 0.5 - 1.9 mmol/L  Resp Panel by RT-PCR (Flu A&B, Covid) Nasopharyngeal Swab     Status: None    Collection Time: 12/05/20  4:18 PM    Specimen: Nasopharyngeal Swab; Nasopharyngeal(NP) swabs in vial transport medium  Result Value Ref Range    SARS Coronavirus 2 by RT PCR NEGATIVE NEGATIVE    Influenza A by PCR NEGATIVE NEGATIVE    Influenza B by PCR NEGATIVE NEGATIVE  CBC     Status: Abnormal    Collection Time: 12/05/20  4:23 PM  Result Value Ref Range    WBC 10.5 4.0 - 10.5 K/uL    RBC 3.67 (L) 3.87 - 5.11 MIL/uL    Hemoglobin 10.1 (L) 12.0 - 15.0 g/dL    HCT 19.1 (L) 47.8 - 46.0 %    MCV 87.2 80.0 - 100.0 fL    MCH 27.5 26.0 - 34.0 pg    MCHC 31.6 30.0 - 36.0 g/dL    RDW 29.5 62.1 - 30.8 %    Platelets 463 (H) 150 - 400 K/uL    nRBC 0.0 0.0 - 0.2 %  Comprehensive metabolic panel     Status: Abnormal    Collection Time: 12/05/20  4:23 PM  Result Value Ref Range    Sodium 140 135 - 145 mmol/L    Potassium 3.9 3.5 - 5.1  mmol/L    Chloride 111 98 - 111 mmol/L    CO2 22 22 - 32 mmol/L    Glucose, Bld 96 70 - 99 mg/dL    BUN 7 6 - 20 mg/dL    Creatinine, Ser 6.57 0.44 - 1.00 mg/dL    Calcium 8.8 (L) 8.9 - 10.3 mg/dL    Total Protein 6.8 6.5 - 8.1 g/dL    Albumin 2.7 (L) 3.5 - 5.0 g/dL    AST 17 15 - 41 U/L    ALT 20 0 - 44 U/L    Alkaline Phosphatase 132 (H) 38 - 126 U/L    Total Bilirubin 0.6 0.3 - 1.2 mg/dL    GFR, Estimated >84 >69 mL/min    Anion gap 7 5 - 15  Troponin I (High Sensitivity)     Status: None    Collection Time: 12/05/20  4:23 PM  Result Value Ref Range    Troponin I (High Sensitivity) 9 <18 ng/L  Brain natriuretic peptide     Status: Abnormal    Collection Time: 12/05/20  4:23 PM  Result Value Ref Range    B Natriuretic Peptide 219.9 (H) 0.0 - 100.0 pg/mL  Type and screen [facility]     Status: None    Collection Time: 12/05/20  4:24 PM  Result Value Ref Range    ABO/RH(D) B POS      Antibody Screen NEG      Sample Expiration          12/08/2020,2359 Performed at [facility], [address], [place], [place] [address]    Blood gas, arterial     Status: Abnormal    Collection Time: 12/05/20  6:18 PM  Result Value Ref Range    FIO2 1.00      O2 Content 6.0 L/min    Delivery systems NASAL CANNULA      pH, Arterial 7.399 7.350 - 7.450    pCO2 arterial 34.3 32.0 - 48.0 mmHg    pO2, Arterial 87.9 83.0 - 108.0 mmHg    Bicarbonate 20.7 20.0 - 28.0 mmol/L    Acid-base deficit 3.0 (H) 0.0 - 2.0 mmol/L    O2 Saturation 97.0 %    Collection site LEFT RADIAL      Drawn by [id number]      Sample type ARTERIAL  Allens test (pass/fail) PASS PASS        CT Angio Chest PE W and/or Wo Contrast   Result Date: 12/05/2020 CLINICAL DATA:  Shortness of breath, chest pain. Normal white blood cell count. Elevated brain natriuretic peptide. EXAM: CT ANGIOGRAPHY CHEST WITH CONTRAST TECHNIQUE: Multidetector CT imaging of the chest was performed using the standard  protocol during bolus administration of intravenous contrast. Multiplanar CT image reconstructions and MIPs were obtained to evaluate the vascular anatomy. CONTRAST:  80mL OMNIPAQUE IOHEXOL 350 MG/ML SOLN COMPARISON:  X-ray 12/05/2020 FINDINGS: Cardiovascular: Satisfactory opacification of the pulmonary arteries to the segmental level. No evidence of pulmonary embolism. Thoracic aorta is normal in course and caliber. Normal heart size. No pericardial effusion. Mediastinum/Nodes: No enlarged mediastinal, hilar, or axillary lymph nodes. Thyroid gland, trachea, and esophagus demonstrate no significant findings. Lungs/Pleura: Extensive dense airspace consolidations and surrounding ground-glass opacity throughout both lungs in a predominantly perihilar and bibasilar distribution. Trace bilateral pleural effusions. No pneumothorax. Upper Abdomen: No acute abnormality. Musculoskeletal: No chest wall abnormality. No acute or significant osseous findings. Review of the MIP images confirms the above findings. IMPRESSION: 1. No evidence of pulmonary embolism. 2. Extensive dense airspace consolidations and surrounding ground-glass opacity throughout both lungs in a predominantly perihilar and bibasilar distribution. Findings could reflect pulmonary edema versus multifocal pneumonia. 3. Trace bilateral pleural effusions. Electronically Signed   By: Duanne Guess D.O.   On: 12/05/2020 19:09    DG CHEST PORT 1 VIEW   Result Date: 12/05/2020 CLINICAL DATA:  Shortness of breath EXAM: PORTABLE CHEST 1 VIEW COMPARISON:  02/14/2011 FINDINGS: Low lung volumes. Borderline to mild cardiomegaly. Perihilar and lower lung airspace disease. No definitive effusion. No pneumothorax. IMPRESSION: Low lung volumes. Borderline cardiomegaly with moderate bilateral mid to lower lung airspace disease, favor pneumonia over edema. Electronically Signed   By: Jasmine Pang M.D.   On: 12/05/2020 16:38    ECHOCARDIOGRAM COMPLETE   Result Date:  12/05/2020    ECHOCARDIOGRAM REPORT   Patient Name:   Melanie Burch Date of Exam: 12/05/2020 Medical Rec #:  [id number]           Height:       64.0 in Accession #:    [id number]          Weight:       270.0 lb Date of Birth:  1995-09-25           BSA:          2.223 m Patient Age:    25 years            BP:           135/69 mmHg Patient Gender: F                   HR:           65 bpm. Exam Location:  Inpatient Procedure: 2D Echo STAT ECHO Indications:    shortness of breath  History:        Patient has no prior history of Echocardiogram examinations.                 Signs/Symptoms:postpartum complications.  Sonographer:    Delcie Roch RDCS Referring Phys: [phone] CAROLINE M NEILL IMPRESSIONS  1. Left ventricular ejection fraction, by estimation, is 60 to 65%. The left ventricle has normal function. The left ventricle has no regional wall motion abnormalities. Left ventricular diastolic parameters were normal.  2. Right ventricular systolic function is normal. The  right ventricular size is normal. There is mildly elevated pulmonary artery systolic pressure.  3. The mitral valve is normal in structure. Mild mitral valve regurgitation. No evidence of mitral stenosis.  4. The tricuspid valve is abnormal.  5. The aortic valve is tricuspid. Aortic valve regurgitation is not visualized. No aortic stenosis is present.  6. The inferior vena cava is dilated in size with >50% respiratory variability, suggesting right atrial pressure of 8 mmHg. FINDINGS  Left Ventricle: Left ventricular ejection fraction, by estimation, is 60 to 65%. The left ventricle has normal function. The left ventricle has no regional wall motion abnormalities. The left ventricular internal cavity size was normal in size. There is  no left ventricular hypertrophy. Left ventricular diastolic parameters were normal. Right Ventricle: The right ventricular size is normal. No increase in right ventricular wall thickness. Right ventricular systolic  function is normal. There is mildly elevated pulmonary artery systolic pressure. The tricuspid regurgitant velocity is 2.93  m/s, and with an assumed right atrial pressure of 8 mmHg, the estimated right ventricular systolic pressure is 42.3 mmHg. Left Atrium: Left atrial size was normal in size. Right Atrium: Right atrial size was normal in size. Pericardium: There is no evidence of pericardial effusion. Mitral Valve: The mitral valve is normal in structure. Mild mitral valve regurgitation. No evidence of mitral valve stenosis. Tricuspid Valve: The tricuspid valve is abnormal. Tricuspid valve regurgitation is mild . No evidence of tricuspid stenosis. Aortic Valve: The aortic valve is tricuspid. Aortic valve regurgitation is not visualized. No aortic stenosis is present. Pulmonic Valve: The pulmonic valve was not well visualized. Pulmonic valve regurgitation is trivial. No evidence of pulmonic stenosis. Aorta: The aortic root is normal in size and structure. Venous: The inferior vena cava is dilated in size with greater than 50% respiratory variability, suggesting right atrial pressure of 8 mmHg. IAS/Shunts: The interatrial septum was not well visualized.  LEFT VENTRICLE PLAX 2D LVIDd:         4.90 cm  Diastology LVIDs:         3.20 cm  LV e' medial:    11.30 cm/s LV PW:         1.10 cm  LV E/e' medial:  10.3 LV IVS:        1.00 cm  LV e' lateral:   13.30 cm/s LVOT diam:     2.00 cm  LV E/e' lateral: 8.7 LV SV:         72 LV SV Index:   32 LVOT Area:     3.14 cm  RIGHT VENTRICLE             IVC RV S prime:     14.10 cm/s  IVC diam: 2.40 cm TAPSE (M-mode): 2.4 cm LEFT ATRIUM             Index       RIGHT ATRIUM           Index LA diam:        4.20 cm 1.89 cm/m  RA Area:     16.20 cm LA Vol (A2C):   75.7 ml 34.06 ml/m RA Volume:   42.50 ml  19.12 ml/m LA Vol (A4C):   56.2 ml 25.29 ml/m LA Biplane Vol: 67.0 ml 30.15 ml/m  AORTIC VALVE LVOT Vmax:   116.00 cm/s LVOT Vmean:  76.500 cm/s LVOT VTI:    0.228 m  AORTA  Ao Root diam: 3.10 cm Ao Asc diam:  3.10 cm MITRAL VALVE  TRICUSPID VALVE MV Area (PHT): 4.39 cm     TR Peak grad:   34.3 mmHg MV Decel Time: 173 msec     TR Vmax:        293.00 cm/s MV E velocity: 116.00 cm/s MV A velocity: 90.60 cm/s   SHUNTS MV E/A ratio:  1.28         Systemic VTI:  0.23 m                             Systemic Diam: 2.00 cm Dina Rich MD Electronically signed by Dina Rich MD Signature Date/Time: 12/05/2020/5:40:11 PM    Final        MDM CBC, CMP Troponin, BNP, Lactic acid COVID test Blood gas Chest Xray CT Angio Chest PE ED EKG- normal sinus rhythm  Echocardiogram   Patient arrived with O2 saturations at 70-72% without oxygen. Nasal cannula replaced and O2 increased to 7L to get patient at 97% consistently.   Dr. Vergie Living notified of patient arrival and acuity.    During the evaluation, patient developed a headache she rates a 5/10. Tylenol and Reglan PO- patient reports resolution of headache   Dr. Vergie Living notified of all results. Notified that patient still has significant difficulty breathing with exertion and still requiring 6L O2 by nasal cannula to maintain her O2 saturations. Will come see patient to discuss admission.   Assessment and Plan  - Postpartum preeclampsia   -Admit OBSC for magnesium -Care turned over to MD   Rolm Bookbinder CNM 12/05/2020, 7:20 PM         Cosigned by: Baylis Bing, MD at 12/05/2020  7:41 PM

## 2020-12-05 NOTE — MAU Note (Signed)
Patient presents to MAU via EMS with SOB and chest pain.  Pt reports being awakened around 0600 today with SOB, worse on exertion but also at rest.  Pt had a c section 10 days ago.  Pt also hypertensive.

## 2020-12-06 ENCOUNTER — Inpatient Hospital Stay (HOSPITAL_COMMUNITY): Payer: 59

## 2020-12-06 ENCOUNTER — Encounter (HOSPITAL_COMMUNITY): Payer: Self-pay | Admitting: Obstetrics and Gynecology

## 2020-12-06 DIAGNOSIS — O900 Disruption of cesarean delivery wound: Secondary | ICD-10-CM

## 2020-12-06 DIAGNOSIS — J9601 Acute respiratory failure with hypoxia: Secondary | ICD-10-CM | POA: Diagnosis not present

## 2020-12-06 DIAGNOSIS — R7989 Other specified abnormal findings of blood chemistry: Secondary | ICD-10-CM | POA: Diagnosis present

## 2020-12-06 DIAGNOSIS — E876 Hypokalemia: Secondary | ICD-10-CM | POA: Diagnosis not present

## 2020-12-06 DIAGNOSIS — Z6841 Body Mass Index (BMI) 40.0 and over, adult: Secondary | ICD-10-CM

## 2020-12-06 DIAGNOSIS — O165 Unspecified maternal hypertension, complicating the puerperium: Secondary | ICD-10-CM | POA: Diagnosis present

## 2020-12-06 DIAGNOSIS — J810 Acute pulmonary edema: Secondary | ICD-10-CM | POA: Diagnosis present

## 2020-12-06 DIAGNOSIS — R0602 Shortness of breath: Secondary | ICD-10-CM

## 2020-12-06 DIAGNOSIS — E871 Hypo-osmolality and hyponatremia: Secondary | ICD-10-CM | POA: Diagnosis not present

## 2020-12-06 DIAGNOSIS — M7989 Other specified soft tissue disorders: Secondary | ICD-10-CM

## 2020-12-06 DIAGNOSIS — Z98891 History of uterine scar from previous surgery: Secondary | ICD-10-CM

## 2020-12-06 LAB — CBC
HCT: 32.6 % — ABNORMAL LOW (ref 36.0–46.0)
HCT: 33.3 % — ABNORMAL LOW (ref 36.0–46.0)
HCT: 31.7 % — ABNORMAL LOW (ref 36.0–46.0)
Hemoglobin: 10.3 g/dL — ABNORMAL LOW (ref 12.0–15.0)
Hemoglobin: 10.4 g/dL — ABNORMAL LOW (ref 12.0–15.0)
Hemoglobin: 10.2 g/dL — ABNORMAL LOW (ref 12.0–15.0)
MCH: 27.4 pg (ref 26.0–34.0)
MCH: 27.2 pg (ref 26.0–34.0)
MCH: 27.6 pg (ref 26.0–34.0)
MCHC: 31.6 g/dL (ref 30.0–36.0)
MCHC: 31.2 g/dL (ref 30.0–36.0)
MCHC: 32.2 g/dL (ref 30.0–36.0)
MCV: 86.7 fL (ref 80.0–100.0)
MCV: 86.9 fL (ref 80.0–100.0)
MCV: 85.7 fL (ref 80.0–100.0)
Platelets: 481 10*3/uL — ABNORMAL HIGH (ref 150–400)
Platelets: 515 10*3/uL — ABNORMAL HIGH (ref 150–400)
Platelets: 436 10*3/uL — ABNORMAL HIGH (ref 150–400)
RBC: 3.76 MIL/uL — ABNORMAL LOW (ref 3.87–5.11)
RBC: 3.83 MIL/uL — ABNORMAL LOW (ref 3.87–5.11)
RBC: 3.7 MIL/uL — ABNORMAL LOW (ref 3.87–5.11)
RDW: 13.7 % (ref 11.5–15.5)
RDW: 13.7 % (ref 11.5–15.5)
RDW: 13.7 % (ref 11.5–15.5)
WBC: 16.1 10*3/uL — ABNORMAL HIGH (ref 4.0–10.5)
WBC: 17.8 10*3/uL — ABNORMAL HIGH (ref 4.0–10.5)
WBC: 15.5 10*3/uL — ABNORMAL HIGH (ref 4.0–10.5)
nRBC: 0 % (ref 0.0–0.2)
nRBC: 0 % (ref 0.0–0.2)
nRBC: 0 % (ref 0.0–0.2)

## 2020-12-06 LAB — BLOOD GAS, ARTERIAL
Acid-base deficit: 1.7 mmol/L (ref 0.0–2.0)
Bicarbonate: 22.5 mmol/L (ref 20.0–28.0)
Drawn by: 559801
O2 Saturation: 96.2 %
PEEP: 6 cmH2O
pCO2 arterial: 38.7 mmHg (ref 32.0–48.0)
pH, Arterial: 7.384 (ref 7.350–7.450)
pO2, Arterial: 83.8 mmHg (ref 83.0–108.0)

## 2020-12-06 LAB — BASIC METABOLIC PANEL
Anion gap: 6 (ref 5–15)
BUN: 5 mg/dL — ABNORMAL LOW (ref 6–20)
CO2: 24 mmol/L (ref 22–32)
Calcium: 8.6 mg/dL — ABNORMAL LOW (ref 8.9–10.3)
Chloride: 107 mmol/L (ref 98–111)
Creatinine, Ser: 0.65 mg/dL (ref 0.44–1.00)
GFR, Estimated: >60 mL/min (ref >60)
Glucose, Bld: 90 mg/dL (ref 70–99)
Potassium: 4.1 mmol/L (ref 3.5–5.1)
Sodium: 137 mmol/L (ref 135–145)

## 2020-12-06 LAB — COMPREHENSIVE METABOLIC PANEL
ALT: 24 U/L (ref 0–44)
ALT: 28 U/L (ref 0–44)
AST: 26 U/L (ref 15–41)
AST: 25 U/L (ref 15–41)
Albumin: 2.7 g/dL — ABNORMAL LOW (ref 3.5–5.0)
Albumin: 2.8 g/dL — ABNORMAL LOW (ref 3.5–5.0)
Alkaline Phosphatase: 126 U/L (ref 38–126)
Alkaline Phosphatase: 129 U/L — ABNORMAL HIGH (ref 38–126)
Anion gap: 9 (ref 5–15)
Anion gap: 8 (ref 5–15)
BUN: 5 mg/dL — ABNORMAL LOW (ref 6–20)
BUN: 5 mg/dL — ABNORMAL LOW (ref 6–20)
CO2: 24 mmol/L (ref 22–32)
CO2: 24 mmol/L (ref 22–32)
Calcium: 8.3 mg/dL — ABNORMAL LOW (ref 8.9–10.3)
Calcium: 8.7 mg/dL — ABNORMAL LOW (ref 8.9–10.3)
Chloride: 107 mmol/L (ref 98–111)
Chloride: 106 mmol/L (ref 98–111)
Creatinine, Ser: 0.74 mg/dL (ref 0.44–1.00)
Creatinine, Ser: 0.72 mg/dL (ref 0.44–1.00)
GFR, Estimated: >60 mL/min (ref >60)
GFR, Estimated: >60 mL/min (ref >60)
Glucose, Bld: 117 mg/dL — ABNORMAL HIGH (ref 70–99)
Glucose, Bld: 114 mg/dL — ABNORMAL HIGH (ref 70–99)
Potassium: 3.7 mmol/L (ref 3.5–5.1)
Potassium: 4 mmol/L (ref 3.5–5.1)
Sodium: 140 mmol/L (ref 135–145)
Sodium: 138 mmol/L (ref 135–145)
Total Bilirubin: 0.6 mg/dL (ref 0.3–1.2)
Total Bilirubin: 0.3 mg/dL (ref 0.3–1.2)
Total Protein: 7 g/dL (ref 6.5–8.1)
Total Protein: 7.2 g/dL (ref 6.5–8.1)

## 2020-12-06 LAB — MAGNESIUM
Magnesium: 2.3 mg/dL (ref 1.7–2.4)
Magnesium: 2.1 mg/dL (ref 1.7–2.4)
Magnesium: 2 mg/dL (ref 1.7–2.4)

## 2020-12-06 LAB — RESPIRATORY PANEL BY PCR

## 2020-12-06 LAB — PROCALCITONIN
Procalcitonin: <0.1 ng/mL
Procalcitonin: 0.21 ng/mL

## 2020-12-06 LAB — MRSA NEXT GEN BY PCR, NASAL: MRSA by PCR Next Gen: NOT DETECTED

## 2020-12-06 LAB — STREP PNEUMONIAE URINARY ANTIGEN: Strep Pneumo Urinary Antigen: NEGATIVE

## 2020-12-06 MED ORDER — SODIUM CHLORIDE 0.9 % IV SOLN
500.0000 mg | INTRAVENOUS | Status: AC
  Administered 2020-12-06 – 2020-12-10 (×5): 500 mg via INTRAVENOUS
  Filled 2020-12-06 (×5): qty 500

## 2020-12-06 MED ORDER — FUROSEMIDE 10 MG/ML IJ SOLN: 40.0000 mg | Freq: Once | INTRAMUSCULAR | Status: DC

## 2020-12-06 MED ORDER — ALBUTEROL SULFATE (2.5 MG/3ML) 0.083% IN NEBU
2.5000 mg | INHALATION_SOLUTION | RESPIRATORY_TRACT | Status: DC | PRN
  Administered 2020-12-06: 2.5 mg via RESPIRATORY_TRACT
  Filled 2020-12-06: qty 3

## 2020-12-06 MED ORDER — LORAZEPAM 2 MG/ML IJ SOLN
0.5000 mg | Freq: Four times a day (QID) | INTRAMUSCULAR | Status: DC | PRN
  Administered 2020-12-06 – 2020-12-07 (×2): 0.5 mg via INTRAVENOUS
  Filled 2020-12-06 (×2): qty 1

## 2020-12-06 MED ORDER — POTASSIUM CHLORIDE CRYS ER 20 MEQ PO TBCR
40.0000 meq | EXTENDED_RELEASE_TABLET | Freq: Two times a day (BID) | ORAL | Status: DC
  Administered 2020-12-06: 40 meq via ORAL
  Filled 2020-12-06: qty 2

## 2020-12-06 MED ORDER — FUROSEMIDE 10 MG/ML IJ SOLN
40.0000 mg | Freq: Once | INTRAMUSCULAR | Status: AC
  Administered 2020-12-06: 40 mg via INTRAVENOUS
  Filled 2020-12-06: qty 4

## 2020-12-06 MED ORDER — PANTOPRAZOLE SODIUM 40 MG IV SOLR
40.0000 mg | Freq: Every day | INTRAVENOUS | Status: DC
  Administered 2020-12-06 – 2020-12-08 (×4): 40 mg via INTRAVENOUS
  Filled 2020-12-06 (×4): qty 40

## 2020-12-06 MED ORDER — POTASSIUM CHLORIDE CRYS ER 20 MEQ PO TBCR
40.0000 meq | EXTENDED_RELEASE_TABLET | Freq: Once | ORAL | Status: DC
  Filled 2020-12-06: qty 2

## 2020-12-06 MED ORDER — CEFTRIAXONE SODIUM 2 G IJ SOLR
2.0000 g | INTRAMUSCULAR | Status: AC
Start: 2020-12-06 — End: 2020-12-10
  Administered 2020-12-06 – 2020-12-10 (×5): 2 g via INTRAVENOUS
  Filled 2020-12-06 (×5): qty 20

## 2020-12-06 MED ORDER — POTASSIUM CHLORIDE 10 MEQ/100ML IV SOLN
10.0000 meq | INTRAVENOUS | Status: AC
  Administered 2020-12-06 (×4): 10 meq via INTRAVENOUS
  Filled 2020-12-06 (×3): qty 100

## 2020-12-06 MED ORDER — LACTATED RINGERS IV SOLN: INTRAVENOUS | Status: DC

## 2020-12-06 MED ORDER — FUROSEMIDE 10 MG/ML IJ SOLN
80.0000 mg | Freq: Once | INTRAMUSCULAR | Status: AC
  Administered 2020-12-06: 80 mg via INTRAVENOUS
  Filled 2020-12-06: qty 8

## 2020-12-06 MED ORDER — POTASSIUM CHLORIDE 20 MEQ PO PACK
40.0000 meq | PACK | Freq: Two times a day (BID) | ORAL | Status: DC
  Administered 2020-12-06 – 2020-12-07 (×3): 40 meq via ORAL
  Filled 2020-12-06 (×5): qty 2

## 2020-12-06 NOTE — Progress Notes (Signed)
Pt off BIPAP at this time and on [place]. RN at bedside and stated she had taken her off to give meds. RN states she will place pt back on BIPAP when pt is ready.

## 2020-12-06 NOTE — Consult Note (Signed)
Melanie Burch, MRN:  [id number], DOB:  September 24, 1995, LOS: 1 ADMISSION DATE:  12/05/2020, CONSULTATION DATE:  9/25 REFERRING MD:  Dr. Vergie Living, CHIEF COMPLAINT:  Resp distress   History of Present Illness:  Patient is a 25 year old female with PMH of C-section (10 days ago) presents to [facility] on 9/24 with respiratory distress.  Patient states on 9/24 she had sudden onset shortness of breath while she was sleeping.  She states that it felt like chest tightness and unable to take a deep breath.  Worse with exertion and lying flat.  Other symptoms were chills, cough, swelling of BLE, and a headache.  Patient denies chest pain, fever, wheezing, weight changes.  Patient denies any history of asthma or sleep apnea.    Upon arrival to [facility], patient hypoxic and increased respiratory rate.  Increasing O2 requirements now on nasal cannula 8 L with sats lower 90s.  Patient having to sit up in bed to reduce WOB.  No fever.  C-section incision with minimal drainage does not appear to be infected per GYN.  Initial BP 143/81, now 108/66.  CXR shows low lung volumes, cardiomegaly, bilateral edema versus pneumonia.  CTA chest without signs of PE, trace bilateral pleural effusions, extensive airspace consolidations and surrounding groundglass opacity (pulmonary edema versus pneumonia (ABG normal.  COVID flu negative.  EKG normal sinus.  No significant findings on echo.  WBC initially 10.5 now 15.7.  Procalcitonin pending.  BNP 219.  Troponin 9.  Patient given 20 IV Lasix and treated for likely pulmonary edema.  K now 3 and being repleted.  PCCM consulted for respiratory distress.  Pertinent  Medical History   Past Medical History:  Diagnosis Date   GBS bacteriuria 12/05/2020     Significant Hospital Events: Including procedures, antibiotic start and stop dates in addition to other pertinent events   9/25: admitted to [facility] for resp distress  Interim History / Subjective:   Patient tolerating CPAP therapy  but continues to have increased respiratory rate. She is being transferred to a progressive care unit where Bipap can be utilized.   Patient received a total of 40mg  lasix last evening and has received a total of 120mg  this morning.   Patient's mother and father are at the bedside.  Objective   Blood pressure 139/79, pulse 99, temperature 98.9 F (37.2 C), temperature source Oral, resp. rate (!) 32, height 5\' 4"  (1.626 m), weight 131.6 kg, SpO2 93 %.        Intake/Output Summary (Last 24 hours) at 12/06/2020 1139 Last data filed at 12/06/2020 1111 Gross per 24 hour  Intake 1560.88 ml  Output 5575 ml  Net -4014.12 ml   Filed Weights   12/05/20 1941  Weight: 131.6 kg    Examination: General:  mild distress, in bed, CPAP mask in place HEENT: MM pink/moist, sclera anicteric Neuro: alert and oriented x 3, moving all extremities CV: RRR, no murmurs PULM:  diminished breath sounds bilaterally GI: soft, bsx4 active; c section incision clean, dry and intact Extremities: warm/dry, BLE 2+ edema  Skin: no rashes or lesions    Resolved Hospital Problem list     Assessment & Plan:  Acute respiratory failure with hypoxia Pulmonary Edema BMI 49 Differential includes hypertensive emergency leading to pulmonary edema and respiratory failure. Other concern is for possible viral pneumonia, but patient is afebrile. CTA chest is negative for acute PE. - Continue CPAP therapy and will be transitioned to bipap therapy on 2C - Continue IV lasix as  needed, monitor PM chemistry panel - Check extended respiratory viral panel - Procalcitonin is not elevated, will continue to hold antibiotic therapy - check lower extremity dopplers to rule out DVT - There is concern for underlying sleep disordered breathing, she should have outpatient sleep study  Hypokalemia P: -Continue to replete as needed -Trend BMP/mag  Anemia P: -trend CBC  Best Practice (right click and "Reselect all SmartList  Selections" daily)   Diet/type: NPO w/ oral meds DVT prophylaxis: LMWH GI prophylaxis: H2B Lines: N/A Foley:  N/A Code Status:  full code Last date of multidisciplinary goals of care discussion [9/25 discussed plan with patient and family at bedside]  Labs   CBC: Recent Labs  Lab 12/05/20 1623 12/05/20 2300 12/06/20 0412 12/06/20 1013  WBC 10.5 15.7* 16.1* 17.8*  HGB 10.1* 10.1* 10.3* 10.4*  HCT 32.0* 32.0* 32.6* 33.3*  MCV 87.2 86.5 86.7 86.9  PLT 463* 466* 481* 515*    Basic Metabolic Panel: Recent Labs  Lab 12/05/20 1623 12/05/20 2300 12/06/20 0412 12/06/20 1013  NA 140 133* 140 138  K 3.9 3.0* 3.7 4.0  CL 111 102 107 106  CO2 22 23 24 24   GLUCOSE 96 127* 117* 114*  BUN 7 6 5* 5*  CREATININE 0.61 0.71 0.74 0.72  CALCIUM 8.8* 8.3* 8.3* 8.7*  MG  --  3.0* 2.3 2.1   GFR: Estimated Creatinine Clearance: 142.6 mL/min (by C-G formula based on SCr of 0.72 mg/dL). Recent Labs  Lab 12/05/20 1616 12/05/20 1623 12/05/20 2300 12/06/20 0412 12/06/20 1013  PROCALCITON  --   --  <0.10 0.21  --   WBC  --  10.5 15.7* 16.1* 17.8*  LATICACIDVEN 1.0  --   --   --   --     Liver Function Tests: Recent Labs  Lab 12/05/20 1623 12/05/20 2300 12/06/20 0412 12/06/20 1013  AST 17 24 26 25   ALT 20 21 24 28   ALKPHOS 132* 128* 126 129*  BILITOT 0.6 0.4 0.6 0.3  PROT 6.8 6.8 7.0 7.2  ALBUMIN 2.7* 2.7* 2.7* 2.8*   No results for input(s): LIPASE, AMYLASE in the last 168 hours. No results for input(s): AMMONIA in the last 168 hours.  ABG    Component Value Date/Time   PHART 7.384 12/06/2020 0315   PCO2ART 38.7 12/06/2020 0315   PO2ART 83.8 12/06/2020 0315   HCO3 22.5 12/06/2020 0315   ACIDBASEDEF 1.7 12/06/2020 0315   O2SAT 96.2 12/06/2020 0315     Coagulation Profile: No results for input(s): INR, PROTIME in the last 168 hours.  Cardiac Enzymes: No results for input(s): CKTOTAL, CKMB, CKMBINDEX, TROPONINI in the last 168 hours.  HbA1C: No results found  for: HGBA1C  CBG: No results for input(s): GLUCAP in the last 168 hours.     Critical care time: 40 minutes    12/08/2020, MD [facility] Office: [phone]   See Amion for personal pager PCCM on call pager [phone] until 7pm. Please call Elink 7p-7a. [phone]

## 2020-12-06 NOTE — Progress Notes (Signed)
25 yo post partum F (POD 10 csection) who developed flash pulm edema overnight, was diuresed and started on CPAP   Called from [facility] RN this morning RE incr RR. Informed that [facility] response is at bedside -- I discussed case with rapid via phone who plans to transfer pt from womens over to main hospital, to progressive & plan for BiPAP which sounds reasonable.  CXR reviewed, labs reviewed. I ordered additional Lasix + K replacement.    CCM will follow up, if patient status changes or if we are needed sooner please call/page    Tessie Fass MSN, AGACNP-BC [facility] Pulmonary/Critical Care Medicine Amion for pager  12/06/2020, 8:41 AM

## 2020-12-06 NOTE — Lactation Note (Addendum)
Lactation Consultation Note  Patient Name: Melanie Burch OVANV'B Date: 12/06/2020 Reason for consult: Initial assessment;Other (Comment) (PP admission) Age:25 y.o.  LC in to room for postpartum admission. Mother states she pumped ~2 hours ago, collecting ~4 oz of EBM. Mother reports relief after pumping because she was engorged. LC brought pumping supplies and encouraged pumping every 3h. Discussed engorgement remedies and provided sheet with information. Reviewed milk storage.  Encouraged mother and support people to contact [facility] for assistance and/or supplies. Provided LC services brochure.   Feeding Mother's Current Feeding Choice: Breast Milk and Formula  Lactation Tools Discussed/Used Tools: Pump;Coconut oil Breast pump type: Double-Electric Breast Pump Pump Education: Setup, frequency, and cleaning;Milk Storage Reason for Pumping: postpartum admission Pumping frequency: q3 as possible Pumped volume: 120 mL  Interventions Interventions: Expressed milk;DEBP;Education  Discharge Pump: DEBP  Consult Status Consult Status: Follow-up Date: 12/07/20 Follow-up type: In-patient    Dariann Huckaba A Higuera Ancidey 12/06/2020, 2:59 PM

## 2020-12-06 NOTE — Progress Notes (Signed)
OB note [id number] I was notified by nurse that since patient is on cpap that she can't be on OB specialty care. She also states pt had a temp to 38.4. will communicate with critical care team and will need to treat patient as PNA and d/w them re: regimen since recently in the hospital for her c-section on 9/15. Pt had an epidural during that c-section and no issues or complications during her surgery  Cornelia Copa MD Attending Center for [facility]) GYN Consult Phone: [phone] (M-F, [phone]) & [phone] (Off hours, weekends, holidays)

## 2020-12-06 NOTE — Significant Event (Signed)
Rapid Response Event Note   Reason for Call :  Pulmonary Edema  Initial Focused Assessment:  I was called because this patient who is 10 days post partum developed flash pulmonary edema overnight and required CPAP. IV diuretics given overnight and the patient diuresed about 800 cc according to the RN. The RN denies that the urine looked foamy or cloudy. The patient denies active chest pain or SOB. She was on the CPAP during my exam. She was taken off of the CPAP briefly and maintained her saturation in the high 90's. Patient placed back on positive pressure. Bilateral crackles and diminished lower lung sounds. The patient has +3 LE edema.    Plan of Care:  Discussed POC with CCM about transfer of this patient to a progressive care unit and remain on BiPAP. She has ordered 80 mg IV furosemide for this patient as well as K+ replacement. Mg Dc'd overnight   Event Summary:   MD Notified: CCM and Dr. Para March Call Time: 0800 Arrival Time: 0810 End Time: 0910  Andrey Spearman, RN

## 2020-12-06 NOTE — Progress Notes (Signed)
RN to room to assess patient in morning shift. Pt with increased work of breathing, tachypneic, requiring CPAP at 8L. Pt unable to take a few sips of water without desaturating into lower 70s. OB notified. Continue CPAP. See flowsheets. Called Main rapid response and requested evaluation of patient asap. Critical Care Team, [facility], oncoming OB, and charge RN notified of status of patient and need for higher level of care. See notes from critical care and rapid response RN. Continue to monitor until transfer.

## 2020-12-06 NOTE — Consult Note (Addendum)
NAMEDaphney Burch, MRN:  [id number], DOB:  May 16, 1995, LOS: 1 ADMISSION DATE:  12/05/2020, CONSULTATION DATE:  9/25 REFERRING MD:  Dr. Vergie Living, CHIEF COMPLAINT:  Resp distress   History of Present Illness:  Patient is a 25 year old female with PMH of C-section (10 days ago) presents to [facility] on 9/24 with respiratory distress.  Patient states on 9/24 she had sudden onset shortness of breath while she was sleeping.  She states that it felt like chest tightness and unable to take a deep breath.  Worse with exertion and lying flat.  Other symptoms were chills, cough, swelling of BLE, and a headache.  Patient denies chest pain, fever, wheezing, weight changes.  Patient denies any history of asthma or sleep apnea.    Upon arrival to [facility], patient hypoxic and increased respiratory rate.  Increasing O2 requirements now on nasal cannula 8 L with sats lower 90s.  Patient having to sit up in bed to reduce WOB.  No fever.  C-section incision with minimal drainage does not appear to be infected per GYN.  Initial BP 143/81, now 108/66.  CXR shows low lung volumes, cardiomegaly, bilateral edema versus pneumonia.  CTA chest without signs of PE, trace bilateral pleural effusions, extensive airspace consolidations and surrounding groundglass opacity (pulmonary edema versus pneumonia (ABG normal.  COVID flu negative.  EKG normal sinus.  No significant findings on echo.  WBC initially 10.5 now 15.7.  Procalcitonin pending.  BNP 219.  Troponin 9.  Patient given 20 IV Lasix and treated for likely pulmonary edema.  K now 3 and being repleted.  PCCM consulted for respiratory distress.  Pertinent  Medical History   Past Medical History:  Diagnosis Date   GBS bacteriuria 12/05/2020     Significant Hospital Events: Including procedures, antibiotic start and stop dates in addition to other pertinent events   9/25: admitted to [facility] for resp distress  Interim History / Subjective:  Patient awake and following  commands. Patient sitting up in bed on 8 L nasal cannula with sats 92% Patient able to speak in complete sentences Respiratory rate upper 20s  Objective   Blood pressure 108/66, pulse 100, temperature 99.3 F (37.4 C), temperature source Oral, resp. rate (!) 38, height 5\' 4"  (1.626 m), weight 131.6 kg, SpO2 95 %.        Intake/Output Summary (Last 24 hours) at 12/06/2020 0040 Last data filed at 12/05/2020 2330 Gross per 24 hour  Intake 625.51 ml  Output 2025 ml  Net -1399.49 ml   Filed Weights   12/05/20 1941  Weight: 131.6 kg    Examination: General:  mild resp distress; afebrile HEENT: MM pink/moist; [place] in place Neuro: Aox3; MAE CV: s1s2, RRR, no m/r/g PULM:  rales BS bilaterally; [place] 8 L w/ sats 92% GI: soft, bsx4 active; c section incision w/ minimal drainage without warmth Extremities: warm/dry, BLE 1+ edema  Skin: no rashes or lesions    Resolved Hospital Problem list     Assessment & Plan:  Acute respiratory failure with hypoxia: likely pulm edema vs pneumonia. Elevated BNP Hx of C-section (10 days ago) BMI 49 P: -We will start on CPAP nightly and as needed -Wean O2 for sats greater than 92% -If patient's respiratory status improves overnight, consider nasal cannula during the day -We will give another dose of Lasix -As needed albuterol for wheezing/cough -If patient's respiratory status worsens consider moving to ICU for closer monitoring -procalcitonin pending; consider antibiotics and expectorated sputum if elevated -Pulm toiletry: IS  and flutter -CXR am -Educated patient and significant other to be aware of any signs of snoring upon discharge; may need outpatient sleep study to rule out possible sleep apnea  Hypokalemia P: -Continue to replete orally -Trend BMP/mag  Anemia P: -trend CBC   Best Practice (right click and "Reselect all SmartList Selections" daily)   Diet/type: NPO w/ oral meds DVT prophylaxis: LMWH GI prophylaxis: H2B Lines:  N/A Foley:  N/A Code Status:  full code Last date of multidisciplinary goals of care discussion [9/25 discussed plan with patient and family at bedside]  Labs   CBC: Recent Labs  Lab 12/05/20 1623 12/05/20 2300  WBC 10.5 15.7*  HGB 10.1* 10.1*  HCT 32.0* 32.0*  MCV 87.2 86.5  PLT 463* 466*    Basic Metabolic Panel: Recent Labs  Lab 12/05/20 1623 12/05/20 2300  NA 140 133*  K 3.9 3.0*  CL 111 102  CO2 22 23  GLUCOSE 96 127*  BUN 7 6  CREATININE 0.61 0.71  CALCIUM 8.8* 8.3*  MG  --  3.0*   GFR: Estimated Creatinine Clearance: 142.6 mL/min (by C-G formula based on SCr of 0.71 mg/dL). Recent Labs  Lab 12/05/20 1616 12/05/20 1623 12/05/20 2300  WBC  --  10.5 15.7*  LATICACIDVEN 1.0  --   --     Liver Function Tests: Recent Labs  Lab 12/05/20 1623 12/05/20 2300  AST 17 24  ALT 20 21  ALKPHOS 132* 128*  BILITOT 0.6 0.4  PROT 6.8 6.8  ALBUMIN 2.7* 2.7*   No results for input(s): LIPASE, AMYLASE in the last 168 hours. No results for input(s): AMMONIA in the last 168 hours.  ABG    Component Value Date/Time   PHART 7.399 12/05/2020 1818   PCO2ART 34.3 12/05/2020 1818   PO2ART 87.9 12/05/2020 1818   HCO3 20.7 12/05/2020 1818   ACIDBASEDEF 3.0 (H) 12/05/2020 1818   O2SAT 97.0 12/05/2020 1818     Coagulation Profile: No results for input(s): INR, PROTIME in the last 168 hours.  Cardiac Enzymes: No results for input(s): CKTOTAL, CKMB, CKMBINDEX, TROPONINI in the last 168 hours.  HbA1C: No results found for: HGBA1C  CBG: No results for input(s): GLUCAP in the last 168 hours.  Review of Systems:   Positives in bold  Gen: fever, chills, weight change, fatigue, night sweats HEENT:  blurred vision, double vision, hearing loss, tinnitus, sinus congestion, rhinorrhea, sore throat, neck stiffness, dysphagia PULM:  shortness of breath, chest tightness, cough, clear sputum production, hemoptysis, wheezing CV: chest pain, edema, orthopnea, paroxysmal  nocturnal dyspnea, palpitations GI:  abdominal pain, nausea, vomiting, diarrhea, hematochezia, melena, constipation, change in bowel habits GU: dysuria, hematuria, polyuria, oliguria, urethral discharge Endocrine: hot or cold intolerance, polyuria, polyphagia or appetite change Derm: rash, dry skin, scaling or peeling skin change Heme: easy bruising, bleeding, bleeding gums Neuro: headache, numbness, weakness, slurred speech, loss of memory or consciousness   Past Medical History:  She,  has a past medical history of GBS bacteriuria (12/05/2020).   Surgical History:   Past Surgical History:  Procedure Laterality Date   CESAREAN SECTION       Social History:   reports that she has never smoked. She has never used smokeless tobacco. She reports current alcohol use. She reports that she does not use drugs.   Family History:  Her family history is not on file.   Allergies No Known Allergies   Home Medications  Prior to Admission medications   Medication Sig Start Date  End Date Taking? Authorizing Provider  ibuprofen (ADVIL) 800 MG tablet Take 800 mg by mouth every 8 (eight) hours as needed.   Yes [provider]  doxycycline (VIBRAMYCIN) 100 MG capsule Take 1 capsule (100 mg total) by mouth 2 (two) times daily. 09/15/19   Horton, Mayer Masker, MD  ondansetron (ZOFRAN ODT) 4 MG disintegrating tablet Take 1 tablet (4 mg total) by mouth every 8 (eight) hours as needed for nausea or vomiting. 09/17/19   Sabas Sous, MD  oxyCODONE (ROXICODONE) 5 MG immediate release tablet Take 1 tablet (5 mg total) by mouth every 4 (four) hours as needed for severe pain. 09/17/19   Sabas Sous, MD  valACYclovir (VALTREX) 1000 MG tablet Take 1 tablet (1,000 mg total) by mouth 2 (two) times daily. 09/15/19   Horton, Mayer Masker, MD     Critical care time: 45 minutes    JD Anselm Lis [place] [facility] 12/06/2020, 12:43 AM  Please see [email or web address] for pager details.  From 7A-7P if  no response, please call [phone]. After hours, please call ELink [phone].

## 2020-12-06 NOTE — Progress Notes (Signed)
Daily Postpartum Note  Admission Date: 12/05/2020 Current Date: 12/06/2020 8:11 PM  Melanie Burch is a 25 y.o. G1P1001 HD#2, admitted for pulmonary edema due to severe pre-eclampsia; patient had a 9/15 c-section in [place] due to chorio and is in town visiting family.  Pregnancy complicated by: Patient Active Problem List   Diagnosis Date Noted   Acute hypoxemic respiratory failure (HCC) 12/06/2020   Acute pulmonary edema (HCC) 12/06/2020   Hypokalemia 12/06/2020   Postpartum hypertension 12/06/2020   Hyponatremia 12/06/2020   Elevated brain natriuretic peptide (BNP) level 12/06/2020   Hx of cesarean section 12/05/2020   Severe pre-eclampsia 12/05/2020   Wound dehiscence, cesarean 12/05/2020   BMI 40.0-44.9, adult (HCC) 12/05/2020   Obesity in pregnancy 12/05/2020    Overnight/24hr events:  Patient started on cpap and then bipap. She then switched to HFNC 13L. She developed a fever this morning and was started on ceftriaxone/azithro overnight.   Subjective:  Patient feeling better than earlier today but still SOB.   Objective:   Intake/Output Summary (Last 24 hours) at 12/06/2020 2011 Last data filed at 12/06/2020 1730 Gross per 24 hour  Intake 1600.88 ml  Output 4525 ml  Net -2924.12 ml      Current Vital Signs 24h Vital Sign Ranges  T (!) 100.4 F (38 C) Temp  Avg: 99 F (37.2 C)  Min: 97.5 F (36.4 C)  Max: 101.1 F (38.4 C)  BP (!) 133/95 BP  Min: 108/66  Max: 151/88  HR (!) 111 Pulse  Avg: 104.3  Min: 75  Max: 127  RR (!) 43  Resp  Avg: 35.6  Min: 23  Max: 44  SaO2 95 % Room Air SpO2  Avg: 95.5 %  Min: 71 %  Max: 100 %       24 Hour I/O Current Shift I/O  Time Ins Outs 09/24 0701 - 09/25 0700 In: 1105.5 [P.O.:1030; I.V.:75.5] Out: 3625 [Urine:3625] No intake/output data recorded.   Patient Vitals for the past 24 hrs:  BP Temp Temp src Pulse Resp SpO2  12/06/20 2009 -- -- -- (!) 111 (!) 43 95 %  12/06/20 1943 (!) 133/95 (!) 100.4 F (38 C) Oral  (!) 108 (!) 32 98 %  12/06/20 1920 -- -- -- (!) 111 -- 97 %  12/06/20 1700 (!) 114/97 100 F (37.8 C) Oral (!) 111 (!) 35 95 %  12/06/20 1600 138/76 -- -- (!) 105 (!) 32 100 %  12/06/20 1550 -- -- -- -- (!) 27 --  12/06/20 1540 -- -- -- (!) 106 (!) 38 100 %  12/06/20 1531 121/82 -- -- (!) 111 -- 96 %  12/06/20 1500 121/82 -- -- 96 (!) 44 98 %  12/06/20 1400 115/62 -- -- 91 (!) 38 100 %  12/06/20 1300 (!) 151/88 -- -- (!) 103 (!) 30 100 %  12/06/20 1250 (!) 141/85 99.4 F (37.4 C) Oral (!) 107 (!) 42 99 %  12/06/20 1200 -- -- -- (!) 111 (!) 31 96 %  12/06/20 1043 -- -- -- -- -- 93 %  12/06/20 0915 139/79 -- -- 99 (!) 32 96 %  12/06/20 0902 -- 98.9 F (37.2 C) Oral -- -- --  12/06/20 0835 -- -- -- (!) 113 (!) 38 96 %  12/06/20 0801 -- -- -- -- -- 96 %  12/06/20 0756 140/74 (!) 101.1 F (38.4 C) Axillary (!) 115 (!) 42 98 %  12/06/20 0751 -- -- -- -- -- 96 %  12/06/20 0746 -- -- -- -- --  97 %  12/06/20 0741 -- -- -- -- -- 100 %  12/06/20 0736 -- -- -- -- -- 98 %  12/06/20 0731 -- -- -- -- -- 97 %  12/06/20 0730 -- -- -- -- -- 97 %  12/06/20 0726 -- -- -- -- -- 95 %  12/06/20 0721 -- -- -- -- -- (!) 87 %  12/06/20 0718 -- -- -- -- -- (!) 71 %  12/06/20 0716 -- -- -- -- -- 92 %  12/06/20 0711 -- -- -- -- -- 95 %  12/06/20 0706 -- -- -- -- -- 96 %  12/06/20 0701 -- -- -- -- -- 93 %  12/06/20 0509 136/88 -- -- 99 -- --  12/06/20 0500 -- -- -- -- -- 96 %  12/06/20 0409 140/87 98.1 F (36.7 C) Axillary (!) 114 (!) 38 96 %  12/06/20 0300 -- -- -- -- -- 97 %  12/06/20 0247 -- -- -- -- -- 94 %  12/06/20 0227 -- -- -- (!) 127 (!) 23 96 %  12/06/20 0220 -- -- -- -- -- 96 %  12/06/20 0100 -- -- -- -- -- 93 %  12/05/20 2358 108/66 99.3 F (37.4 C) Oral 100 (!) 38 95 %  12/05/20 2312 -- -- -- -- -- 95 %  12/05/20 2302 -- -- -- -- -- 90 %  12/05/20 2204 128/85 (!) 97.5 F (36.4 C) Oral 98 (!) 37 97 %  12/05/20 2106 -- 98.1 F (36.7 C) Oral -- (!) 36 --  12/05/20 2103 (!) 134/109 --  -- -- -- --  12/05/20 2053 (!) 121/51 98.3 F (36.8 C) Oral 84 (!) 35 98 %  12/05/20 2017 125/72 98.3 F (36.8 C) Oral 75 (!) 36 98 %     Physical exam: General: Well nourished, well developed female in no acute distress. Abdomen: soft, nttp, nd Cardiovascular: S1, S2 normal, no murmur, rub or gallop, regular rate and rhythm Respiratory: CTAB, no respiratory distress Skin: Warm and dry.   Medications: Current Facility-Administered Medications  Medication Dose Route Frequency Provider Last Rate Last Admin   acetaminophen (TYLENOL) tablet 650 mg  650 mg Oral Q4H PRN [place] Bing, MD   650 mg at 12/06/20 0824   albuterol (PROVENTIL) (2.5 MG/3ML) 0.083% nebulizer solution 2.5 mg  2.5 mg Nebulization Q4H PRN Pia Mau D, PA-C   2.5 mg at 12/06/20 0207   azithromycin (ZITHROMAX) 500 mg in sodium chloride 0.9 % 250 mL IVPB  500 mg Intravenous Q24H Martina Sinner, MD 250 mL/hr at 12/06/20 1902 500 mg at 12/06/20 1902   calcium carbonate (TUMS - dosed in mg elemental calcium) chewable tablet 400 mg of elemental calcium  2 tablet Oral Q4H PRN [place] Bing, MD       cefTRIAXone (ROCEPHIN) 2 g in sodium chloride 0.9 % 100 mL IVPB  2 g Intravenous Q24H Melody Comas B, MD 200 mL/hr at 12/06/20 1730 2 g at 12/06/20 1730   docusate sodium (COLACE) capsule 100 mg  100 mg Oral BID PRN [place] Bing, MD       enoxaparin (LOVENOX) injection 60 mg  60 mg Subcutaneous Q24H [place] Bing, MD   60 mg at 12/05/20 2105   famotidine (PEPCID) tablet 20 mg  20 mg Oral BID PRN  Bing, MD       furosemide (LASIX) injection 40 mg  40 mg Intravenous Once Martina Sinner, MD       guaiFENesin-codeine 100-10 MG/5ML solution 10 mL  10 mL Oral Q4H PRN [place] Bing, MD   10 mL at 12/06/20 1829   hydrALAZINE (APRESOLINE) injection 5 mg  5 mg Intravenous PRN [place] Bing, MD       And   hydrALAZINE (APRESOLINE) injection 10 mg  10 mg Intravenous PRN [place] Bing, MD       And    labetalol (NORMODYNE) injection 20 mg  20 mg Intravenous PRN [place] Bing, MD       And   labetalol (NORMODYNE) injection 40 mg  40 mg Intravenous PRN [place] Bing, MD       LORazepam (ATIVAN) injection 0.5 mg  0.5 mg Intravenous Q6H PRN Martina Sinner, MD       ondansetron (ZOFRAN) injection 4 mg  4 mg Intravenous Q8H PRN [place] Bing, MD       oxyCODONE (Oxy IR/ROXICODONE) immediate release tablet 5 mg  5 mg Oral Q6H PRN [place] Bing, MD       pantoprazole (PROTONIX) injection 40 mg  40 mg Intravenous QHS Marcelle Smiling, MD   40 mg at 12/06/20 0332   potassium chloride (KLOR-CON) packet 40 mEq  40 mEq Oral BID Marcelle Smiling, MD   40 mEq at 12/06/20 1025   prenatal multivitamin tablet 1 tablet  1 tablet Oral Q1200 [place] Bing, MD   1 tablet at 12/06/20 1025    Labs:  Recent Labs  Lab 12/06/20 0412 12/06/20 1013 12/06/20 1515  WBC 16.1* 17.8* 15.5*  HGB 10.3* 10.4* 10.2*  HCT 32.6* 33.3* 31.7*  PLT 481* 515* 436*     Recent Labs  Lab 12/05/20 2300 12/06/20 0412 12/06/20 1013 12/06/20 1625  NA 133* 140 138 137  K 3.0* 3.7 4.0 4.1  CL 102 107 106 107  CO2 23 24 24 24   BUN 6 5* 5* 5*  CREATININE 0.71 0.74 0.72 0.65  CALCIUM 8.3* 8.3* 8.7* 8.6*  PROT 6.8 7.0 7.2  --   BILITOT 0.4 0.6 0.3  --   ALKPHOS 128* 126 129*  --   ALT 21 24 28   --   AST 24 26 25   --   GLUCOSE 127* 117* 114* 90   Results for NAAVA, JANEWAY (MRN ) as of 12/06/2020 07:58  Ref. Range 12/05/2020 23:00 12/06/2020 04:12  Magnesium Latest Ref Range: 1.7 - 2.4 mg/dL 3.0 (H) 2.3  Results for BERTIE, MCCONATHY (MRN 12/07/2020) as of 12/06/2020 07:58  Ref. Range 12/05/2020 23:00 12/06/2020 04:12  Procalcitonin Latest Units: ng/mL <0.10 0.21    Radiology:  No new imaging  Assessment & Plan:  Pt stable *Postpartum: routine care. Follow up incision tomorrow *Pulmonary: per primary team - on HFNC, but bipap overnight *ID: Ceftriaxone/Azithro *PPx:  lovenox *FEN/GI: regular diet. SLIV *Does not need to pump/dump with current medications - reviewed them in depth with pt and mother  12/08/2020 Attending Center for 12/07/2020 (Faculty Practice) GYN Consult Phone: [phone] (M-F, [phone]) & [phone]  (Off hours, weekends, holidays)

## 2020-12-06 NOTE — Progress Notes (Signed)
VASCULAR LAB    Bilateral lower extremity venous duplex has been performed.  See CV proc for preliminary results.   Jonavan Vanhorn, RVT 12/06/2020, 6:00 PM

## 2020-12-06 NOTE — Progress Notes (Addendum)
OB Note 1224am  Patient with increased WOB and VS stable to slightly worse and rales. pCXR and kdur ordered. PCCM consulted and will evaluate.    Current Vital Signs 24h Vital Sign Ranges  T 99.3 F (37.4 C) Temp  Avg: 98.3 F (36.8 C)  Min: 97.5 F (36.4 C)  Max: 99.3 F (37.4 C)  BP 108/66 BP  Min: 108/66  Max: 154/74  HR 100 Pulse  Avg: 75.8  Min: 62  Max: 109  RR (!) 38  Resp  Avg: 31.4  Min: 18  Max: 38  SaO2 95 % Nasal Cannula SpO2  Avg: 95.6 %  Min: 72 %  Max: 100 %       24 Hour I/O Current Shift I/O  Time Ins Outs 09/24 0701 - 09/25 0700 In: 745.5 [P.O.:670; I.V.:75.5] Out: 2025 [Urine:2025] 09/24 1901 - 09/25 0700 In: 545.5 [P.O.:470; I.V.:75.5] Out: 975 [Urine:975]     Patient Vitals for the past 6 hrs:  BP Temp Temp src Pulse Resp SpO2 Height Weight  12/05/20 2358 108/66 99.3 F (37.4 C) Oral 100 (!) 38 95 % -- --  12/05/20 2312 -- -- -- -- -- 95 % -- --  12/05/20 2302 -- -- -- -- -- 90 % -- --  12/05/20 2204 128/85 (!) 97.5 F (36.4 C) Oral 98 (!) 37 97 % -- --  12/05/20 2106 -- 98.1 F (36.7 C) Oral -- (!) 36 -- -- --  12/05/20 2103 (!) 134/109 -- -- -- -- -- -- --  12/05/20 2053 (!) 121/51 98.3 F (36.8 C) Oral 84 (!) 35 98 % -- --  12/05/20 2017 125/72 98.3 F (36.8 C) Oral 75 (!) 36 98 % -- --  12/05/20 2005 (!) 122/48 -- -- 74 -- -- -- --  12/05/20 2000 -- -- -- -- -- 97 % -- --  12/05/20 1950 117/83 -- -- (!) 109 -- 95 % -- --  12/05/20 1941 -- -- -- -- -- -- 5\' 4"  (1.626 m) 131.6 kg  12/05/20 1920 (!) 128/51 -- -- 69 -- 95 % -- --      12/07/20 MD Attending Center for [facility] (Faculty Practice) GYN Consult Phone: [phone] (M-F, [phone]) & [phone] (Off hours, weekends, holidays)

## 2020-12-06 NOTE — Progress Notes (Signed)
Daily Postpartum Note  Admission Date: 12/05/2020 Current Date: 12/06/2020 7:56 AM  Melanie Burch is a 25 y.o. G1P1001 HD#2, admitted for pulmonary edema due to severe pre-eclampsia; patient had a 9/15 c-section in [place] due to chorio and is in town visiting family.  Pregnancy complicated by: Patient Active Problem List   Diagnosis Date Noted   Acute hypoxemic respiratory failure (HCC) 12/06/2020   Acute pulmonary edema (HCC) 12/06/2020   Hypokalemia 12/06/2020   Postpartum hypertension 12/06/2020   Hyponatremia 12/06/2020   Elevated brain natriuretic peptide (BNP) level 12/06/2020   Hx of cesarean section 12/05/2020   Severe pre-eclampsia 12/05/2020   Wound dehiscence, cesarean 12/05/2020   BMI 40.0-44.9, adult (HCC) 12/05/2020   Obesity in pregnancy 12/05/2020    Overnight/24hr events:  Patient started on cpap by PCCM o/n  Subjective:  Patient feeling better on the cpap  Objective:   Intake/Output Summary (Last 24 hours) at 12/06/2020 0756 Last data filed at 12/06/2020 0620 Gross per 24 hour  Intake 1105.51 ml  Output 3625 ml  Net -2519.49 ml     Current Vital Signs 24h Vital Sign Ranges  T 98.1 F (36.7 C) Temp  Avg: 98.3 F (36.8 C)  Min: 97.5 F (36.4 C)  Max: 99.3 F (37.4 C)  BP 136/88 (Dr. Vergie Living notified BP over 135/85 x2, no new orders) BP  Min: 108/66  Max: 154/74  HR 99 Pulse  Avg: 80.1  Min: 62  Max: 127  RR (!) 38  Resp  Avg: 31.2  Min: 18  Max: 38  SaO2 96 % CPAP SpO2  Avg: 95.6 %  Min: 72 %  Max: 100 %       24 Hour I/O Current Shift I/O  Time Ins Outs 09/24 0701 - 09/25 0700 In: 1105.5 [P.O.:1030; I.V.:75.5] Out: 3625 [Urine:3625] No intake/output data recorded.   Patient Vitals for the past 24 hrs:  BP Temp Temp src Pulse Resp SpO2 Height Weight  12/06/20 0509 136/88 -- -- 99 -- -- -- --  12/06/20 0500 -- -- -- -- -- 96 % -- --  12/06/20 0409 140/87 98.1 F (36.7 C) Axillary (!) 114 (!) 38 96 % -- --  12/06/20 0300 -- -- -- --  -- 97 % -- --  12/06/20 0247 -- -- -- -- -- 94 % -- --  12/06/20 0227 -- -- -- (!) 127 (!) 23 96 % -- --  12/06/20 0220 -- -- -- -- -- 96 % -- --  12/06/20 0100 -- -- -- -- -- 93 % -- --  12/05/20 2358 108/66 99.3 F (37.4 C) Oral 100 (!) 38 95 % -- --  12/05/20 2312 -- -- -- -- -- 95 % -- --  12/05/20 2302 -- -- -- -- -- 90 % -- --  12/05/20 2204 128/85 (!) 97.5 F (36.4 C) Oral 98 (!) 37 97 % -- --  12/05/20 2106 -- 98.1 F (36.7 C) Oral -- (!) 36 -- -- --  12/05/20 2103 (!) 134/109 -- -- -- -- -- -- --  12/05/20 2053 (!) 121/51 98.3 F (36.8 C) Oral 84 (!) 35 98 % -- --  12/05/20 2017 125/72 98.3 F (36.8 C) Oral 75 (!) 36 98 % -- --  12/05/20 2005 (!) 122/48 -- -- 35 -- -- -- --  12/05/20 2000 -- -- -- -- -- 97 % -- --  12/05/20 1950 117/83 -- -- (!) 109 -- 95 % -- --  12/05/20 1941 -- -- -- -- -- --  5\' 4"  (1.626 m) 131.6 kg  12/05/20 1920 (!) 128/51 -- -- 69 -- 95 % -- --  12/05/20 1850 138/66 -- -- 73 -- 95 % -- --  12/05/20 1844 138/66 -- -- 84 -- 93 % -- --  12/05/20 1820 125/82 -- -- 78 -- 98 % -- --  12/05/20 1810 126/70 -- -- 65 -- 100 % -- --  12/05/20 1800 135/68 -- -- 62 -- 99 % -- --  12/05/20 1755 133/72 -- -- 71 -- -- -- --  12/05/20 1740 135/71 -- -- 63 -- 98 % -- --  12/05/20 1730 140/69 -- -- 75 -- 98 % -- --  12/05/20 1720 133/72 -- -- 67 -- 98 % -- --  12/05/20 1710 135/69 -- -- 68 -- 98 % -- --  12/05/20 1700 (!) 143/81 -- -- 68 -- 98 % -- --  12/05/20 1650 139/78 -- -- 71 -- 98 % -- --  12/05/20 1640 134/74 -- -- 66 -- 98 % -- --  12/05/20 1630 135/72 -- -- 74 -- 98 % -- --  12/05/20 1605 (!) 150/83 -- -- 82 20 94 % -- --  12/05/20 1603 (!) 154/74 98.3 F (36.8 C) Oral 74 18 (!) 72 % -- --    Physical exam: General: Well nourished, well developed female in no acute distress. Abdomen: soft, nttp, nd Cardiovascular: S1, S2 normal, no murmur, rub or gallop, regular rate and rhythm Respiratory: CTAB, no respiratory distress Skin: Warm and dry.    Medications: Current Facility-Administered Medications  Medication Dose Route Frequency Provider Last Rate Last Admin   acetaminophen (TYLENOL) tablet 650 mg  650 mg Oral Q4H PRN 12/07/20, MD       albuterol (PROVENTIL) (2.5 MG/3ML) 0.083% nebulizer solution 2.5 mg  2.5 mg Nebulization Q4H PRN [place] Bing D, PA-C   2.5 mg at 12/06/20 0207   calcium carbonate (TUMS - dosed in mg elemental calcium) chewable tablet 400 mg of elemental calcium  2 tablet Oral Q4H PRN 0208, MD       docusate sodium (COLACE) capsule 100 mg  100 mg Oral BID PRN [place] Bing, MD       enoxaparin (LOVENOX) injection 60 mg  60 mg Subcutaneous Q24H [place] Bing, MD   60 mg at 12/05/20 2105   famotidine (PEPCID) tablet 20 mg  20 mg Oral BID PRN 2106, MD       guaiFENesin-codeine 100-10 MG/5ML solution 10 mL  10 mL Oral Q4H PRN [place] Bing, MD   10 mL at 12/06/20 0336   hydrALAZINE (APRESOLINE) injection 5 mg  5 mg Intravenous PRN 12/08/20, MD       And   hydrALAZINE (APRESOLINE) injection 10 mg  10 mg Intravenous PRN [place] Bing, MD       And   labetalol (NORMODYNE) injection 20 mg  20 mg Intravenous PRN [place] Bing, MD       And   labetalol (NORMODYNE) injection 40 mg  40 mg Intravenous PRN [place] Bing, MD       ibuprofen (ADVIL) tablet 600 mg  600 mg Oral Q6H PRN [place] Bing, MD   600 mg at 12/05/20 2254   ondansetron (ZOFRAN) injection 4 mg  4 mg Intravenous Q8H PRN 12/07/20, MD       oxyCODONE (Oxy IR/ROXICODONE) immediate release tablet 5 mg  5 mg Oral Q6H PRN [place] Bing, MD       pantoprazole (PROTONIX) injection 40 mg  40 mg Intravenous QHS Marcelle Smiling, MD   40 mg at 12/06/20 0332   potassium chloride (KLOR-CON) packet 40 mEq  40 mEq Oral BID [place] Smiling, MD   40 mEq at 12/06/20 0332   prenatal multivitamin tablet 1 tablet  1 tablet Oral Q1200 [place] Bing, MD        Labs:  Recent Labs  Lab 12/05/20 1623  12/05/20 2300 12/06/20 0412  WBC 10.5 15.7* 16.1*  HGB 10.1* 10.1* 10.3*  HCT 32.0* 32.0* 32.6*  PLT 463* 466* 481*    Recent Labs  Lab 12/05/20 1623 12/05/20 2300 12/06/20 0412  NA 140 133* 140  K 3.9 3.0* 3.7  CL 111 102 107  CO2 22 23 24   BUN 7 6 5*  CREATININE 0.61 0.71 0.74  CALCIUM 8.8* 8.3* 8.3*  PROT 6.8 6.8 7.0  BILITOT 0.6 0.4 0.6  ALKPHOS 132* 128* 126  ALT 20 21 24   AST 17 24 26   GLUCOSE 96 127* 117*  Results for ZYANYA, GLAZA (MRN ) as of 12/06/2020 07:58  Ref. Range 12/05/2020 23:00 12/06/2020 04:12  Magnesium Latest Ref Range: 1.7 - 2.4 mg/dL 3.0 (H) 2.3  Results for NGAN, QUALLS (MRN 12/07/2020) as of 12/06/2020 07:58  Ref. Range 12/05/2020 23:00 12/06/2020 04:12  Procalcitonin Latest Units: ng/mL <0.10 0.21    Radiology:  No new imaging  Assessment & Plan:  Pt stable *Postpartum: routine care. Follow up incision tomorrow *Pulmonary: *PPx: lovenox *FEN/GI: regular diet. SLIV  12/08/2020 MD Attending Center for [facility] GYN Consult Phone: [phone] (M-F, [phone]) & [phone]  (Off hours, weekends, holidays)

## 2020-12-07 ENCOUNTER — Inpatient Hospital Stay (HOSPITAL_COMMUNITY): Payer: 59

## 2020-12-07 ENCOUNTER — Inpatient Hospital Stay: Payer: Self-pay

## 2020-12-07 DIAGNOSIS — J9601 Acute respiratory failure with hypoxia: Secondary | ICD-10-CM | POA: Diagnosis not present

## 2020-12-07 LAB — CBC WITH DIFFERENTIAL/PLATELET
Abs Immature Granulocytes: 0.08 10*3/uL — ABNORMAL HIGH (ref 0.00–0.07)
Basophils Absolute: 0 10*3/uL (ref 0.0–0.1)
Basophils Relative: 0 %
Eosinophils Absolute: 0.1 10*3/uL (ref 0.0–0.5)
Eosinophils Relative: 0 %
HCT: 32.1 % — ABNORMAL LOW (ref 36.0–46.0)
Hemoglobin: 10.3 g/dL — ABNORMAL LOW (ref 12.0–15.0)
Immature Granulocytes: 0 %
Lymphocytes Relative: 9 %
Lymphs Abs: 1.8 10*3/uL (ref 0.7–4.0)
MCH: 27.6 pg (ref 26.0–34.0)
MCHC: 32.1 g/dL (ref 30.0–36.0)
MCV: 86.1 fL (ref 80.0–100.0)
Monocytes Absolute: 1.1 10*3/uL — ABNORMAL HIGH (ref 0.1–1.0)
Monocytes Relative: 5 %
Neutro Abs: 17.4 10*3/uL — ABNORMAL HIGH (ref 1.7–7.7)
Neutrophils Relative %: 86 %
Platelets: 496 10*3/uL — ABNORMAL HIGH (ref 150–400)
RBC: 3.73 MIL/uL — ABNORMAL LOW (ref 3.87–5.11)
RDW: 13.6 % (ref 11.5–15.5)
WBC: 20.5 10*3/uL — ABNORMAL HIGH (ref 4.0–10.5)
nRBC: 0 % (ref 0.0–0.2)

## 2020-12-07 LAB — POCT I-STAT 7, (LYTES, BLD GAS, ICA,H+H)
Acid-Base Excess: 0 mmol/L (ref 0.0–2.0)
Bicarbonate: 25.9 mmol/L (ref 20.0–28.0)
Calcium, Ion: 1.16 mmol/L (ref 1.15–1.40)
HCT: 29 % — ABNORMAL LOW (ref 36.0–46.0)
Hemoglobin: 9.9 g/dL — ABNORMAL LOW (ref 12.0–15.0)
O2 Saturation: 98 %
Patient temperature: 101.2
Potassium: 3.4 mmol/L — ABNORMAL LOW (ref 3.5–5.1)
Sodium: 139 mmol/L (ref 135–145)
TCO2: 27 mmol/L (ref 22–32)
pCO2 arterial: 50.3 mmHg — ABNORMAL HIGH (ref 32.0–48.0)
pH, Arterial: 7.327 — ABNORMAL LOW (ref 7.350–7.450)
pO2, Arterial: 117 mmHg — ABNORMAL HIGH (ref 83.0–108.0)

## 2020-12-07 LAB — BLOOD GAS, ARTERIAL
Acid-base deficit: 1 mmol/L (ref 0.0–2.0)
Bicarbonate: 23 mmol/L (ref 20.0–28.0)
Drawn by: 53309
FIO2: 20
O2 Saturation: 93.2 %
Patient temperature: 37.5
pCO2 arterial: 37.8 mmHg (ref 32.0–48.0)
pH, Arterial: 7.403 (ref 7.350–7.450)
pO2, Arterial: 72.4 mmHg — ABNORMAL LOW (ref 83.0–108.0)

## 2020-12-07 LAB — COMPREHENSIVE METABOLIC PANEL
ALT: 22 U/L (ref 0–44)
AST: 17 U/L (ref 15–41)
Albumin: 2.5 g/dL — ABNORMAL LOW (ref 3.5–5.0)
Alkaline Phosphatase: 107 U/L (ref 38–126)
Anion gap: 7 (ref 5–15)
BUN: 5 mg/dL — ABNORMAL LOW (ref 6–20)
CO2: 24 mmol/L (ref 22–32)
Calcium: 8.4 mg/dL — ABNORMAL LOW (ref 8.9–10.3)
Chloride: 104 mmol/L (ref 98–111)
Creatinine, Ser: 0.89 mg/dL (ref 0.44–1.00)
GFR, Estimated: >60 mL/min (ref >60)
Glucose, Bld: 122 mg/dL — ABNORMAL HIGH (ref 70–99)
Potassium: 3.8 mmol/L (ref 3.5–5.1)
Sodium: 135 mmol/L (ref 135–145)
Total Bilirubin: 0.7 mg/dL (ref 0.3–1.2)
Total Protein: 6.6 g/dL (ref 6.5–8.1)

## 2020-12-07 LAB — URINALYSIS, COMPLETE (UACMP) WITH MICROSCOPIC
Bacteria, UA: NONE SEEN
Bilirubin Urine: NEGATIVE
Glucose, UA: NEGATIVE mg/dL
Ketones, ur: NEGATIVE mg/dL
Leukocytes,Ua: NEGATIVE
Nitrite: NEGATIVE
Protein, ur: NEGATIVE mg/dL
Specific Gravity, Urine: 1.005 (ref 1.005–1.030)
pH: 6 (ref 5.0–8.0)

## 2020-12-07 LAB — PROCALCITONIN: Procalcitonin: 0.1 ng/mL

## 2020-12-07 LAB — BRAIN NATRIURETIC PEPTIDE: B Natriuretic Peptide: 86.6 pg/mL (ref 0.0–100.0)

## 2020-12-07 LAB — GLUCOSE, CAPILLARY: Glucose-Capillary: 94 mg/dL (ref 70–99)

## 2020-12-07 MED ORDER — PROPOFOL 1000 MG/100ML IV EMUL
INTRAVENOUS | Status: AC
  Filled 2020-12-07: qty 100

## 2020-12-07 MED ORDER — ROCURONIUM BROMIDE 10 MG/ML (PF) SYRINGE
PREFILLED_SYRINGE | INTRAVENOUS | Status: AC
  Filled 2020-12-07: qty 10

## 2020-12-07 MED ORDER — PRENATAL MULTIVITAMIN CH
1.0000 | ORAL_TABLET | Freq: Every day | ORAL | Status: DC
  Administered 2020-12-08 – 2020-12-11 (×4): 1
  Filled 2020-12-07 (×4): qty 1

## 2020-12-07 MED ORDER — FUROSEMIDE 10 MG/ML IJ SOLN
80.0000 mg | Freq: Once | INTRAMUSCULAR | Status: AC
  Administered 2020-12-07: 80 mg via INTRAVENOUS
  Filled 2020-12-07: qty 8

## 2020-12-07 MED ORDER — POTASSIUM CHLORIDE 20 MEQ PO PACK
40.0000 meq | PACK | Freq: Two times a day (BID) | ORAL | Status: DC
  Administered 2020-12-07 – 2020-12-09 (×5): 40 meq
  Filled 2020-12-07 (×7): qty 2

## 2020-12-07 MED ORDER — CALCIUM CARBONATE ANTACID 500 MG PO CHEW: 2.0000 | CHEWABLE_TABLET | ORAL | Status: DC | PRN

## 2020-12-07 MED ORDER — FENTANYL CITRATE (PF) 100 MCG/2ML IJ SOLN: 50.0000 ug | INTRAMUSCULAR | Status: DC | PRN

## 2020-12-07 MED ORDER — CHLORHEXIDINE GLUCONATE 0.12% ORAL RINSE (MEDLINE KIT)
15.0000 mL | Freq: Two times a day (BID) | OROMUCOSAL | Status: DC
  Administered 2020-12-07 – 2020-12-14 (×14): 15 mL via OROMUCOSAL

## 2020-12-07 MED ORDER — FENTANYL CITRATE (PF) 100 MCG/2ML IJ SOLN
INTRAMUSCULAR | Status: AC
  Administered 2020-12-07: 75 ug
  Filled 2020-12-07: qty 2

## 2020-12-07 MED ORDER — MIDAZOLAM HCL 2 MG/2ML IJ SOLN
2.0000 mg | Freq: Once | INTRAMUSCULAR | Status: AC
  Administered 2020-12-07: 2 mg via INTRAVENOUS

## 2020-12-07 MED ORDER — DOCUSATE SODIUM 50 MG/5ML PO LIQD
100.0000 mg | Freq: Two times a day (BID) | ORAL | Status: DC
  Administered 2020-12-07 – 2020-12-10 (×7): 100 mg
  Filled 2020-12-07 (×7): qty 10

## 2020-12-07 MED ORDER — OXYCODONE HCL 5 MG PO TABS: 5.0000 mg | ORAL_TABLET | Freq: Four times a day (QID) | ORAL | Status: DC | PRN

## 2020-12-07 MED ORDER — DOCUSATE SODIUM 50 MG/5ML PO LIQD: 100.0000 mg | Freq: Two times a day (BID) | ORAL | Status: DC

## 2020-12-07 MED ORDER — CHLORHEXIDINE GLUCONATE CLOTH 2 % EX PADS
6.0000 | MEDICATED_PAD | Freq: Every day | CUTANEOUS | Status: DC
  Administered 2020-12-07 – 2020-12-12 (×6): 6 via TOPICAL

## 2020-12-07 MED ORDER — METRONIDAZOLE 500 MG/100ML IV SOLN
500.0000 mg | Freq: Two times a day (BID) | INTRAVENOUS | Status: DC
  Administered 2020-12-07 – 2020-12-09 (×6): 500 mg via INTRAVENOUS
  Filled 2020-12-07 (×6): qty 100

## 2020-12-07 MED ORDER — ORAL CARE MOUTH RINSE
15.0000 mL | OROMUCOSAL | Status: DC
  Administered 2020-12-07 – 2020-12-12 (×44): 15 mL via OROMUCOSAL

## 2020-12-07 MED ORDER — ACETAMINOPHEN 325 MG PO TABS
650.0000 mg | ORAL_TABLET | ORAL | Status: DC | PRN
  Administered 2020-12-07 – 2020-12-12 (×9): 650 mg
  Filled 2020-12-07 (×7): qty 2

## 2020-12-07 MED ORDER — ETOMIDATE 2 MG/ML IV SOLN
40.0000 mg | Freq: Once | INTRAVENOUS | Status: AC
  Administered 2020-12-07: 20 mg via INTRAVENOUS

## 2020-12-07 MED ORDER — MIDAZOLAM HCL 2 MG/2ML IJ SOLN
INTRAMUSCULAR | Status: AC
  Filled 2020-12-07: qty 2

## 2020-12-07 MED ORDER — POLYETHYLENE GLYCOL 3350 17 G PO PACK
17.0000 g | PACK | Freq: Every day | ORAL | Status: DC
  Administered 2020-12-09 – 2020-12-11 (×2): 17 g
  Filled 2020-12-07 (×2): qty 1

## 2020-12-07 MED ORDER — LACTATED RINGERS IV BOLUS
500.0000 mL | Freq: Once | INTRAVENOUS | Status: AC
  Administered 2020-12-07: 500 mL via INTRAVENOUS

## 2020-12-07 MED ORDER — FENTANYL CITRATE (PF) 100 MCG/2ML IJ SOLN
50.0000 ug | Freq: Once | INTRAMUSCULAR | Status: AC
  Administered 2020-12-07: 50 ug via INTRAVENOUS
  Filled 2020-12-07: qty 2

## 2020-12-07 MED ORDER — FENTANYL CITRATE (PF) 100 MCG/2ML IJ SOLN
50.0000 ug | INTRAMUSCULAR | Status: DC | PRN
  Administered 2020-12-07 – 2020-12-09 (×2): 100 ug via INTRAVENOUS

## 2020-12-07 MED ORDER — POLYETHYLENE GLYCOL 3350 17 G PO PACK: 17.0000 g | PACK | Freq: Every day | ORAL | Status: DC

## 2020-12-07 MED ORDER — PROPOFOL 1000 MG/100ML IV EMUL
0.0000 ug/kg/min | INTRAVENOUS | Status: DC
Start: 2020-12-07 — End: 2020-12-12
  Administered 2020-12-07: 35 ug/kg/min via INTRAVENOUS
  Administered 2020-12-07: 50 ug/kg/min via INTRAVENOUS
  Administered 2020-12-07: 10 ug/kg/min via INTRAVENOUS
  Administered 2020-12-08 (×9): 50 ug/kg/min via INTRAVENOUS
  Administered 2020-12-09: 40 ug/kg/min via INTRAVENOUS
  Administered 2020-12-09 (×9): 50 ug/kg/min via INTRAVENOUS
  Administered 2020-12-10: 10 ug/kg/min via INTRAVENOUS
  Administered 2020-12-10 (×2): 50 ug/kg/min via INTRAVENOUS
  Administered 2020-12-10: 40 ug/kg/min via INTRAVENOUS
  Administered 2020-12-10: 15 ug/kg/min via INTRAVENOUS
  Administered 2020-12-11: 10 ug/kg/min via INTRAVENOUS
  Administered 2020-12-11: 15 ug/kg/min via INTRAVENOUS
  Administered 2020-12-11 – 2020-12-12 (×2): 20 ug/kg/min via INTRAVENOUS
  Administered 2020-12-12: 40 ug/kg/min via INTRAVENOUS
  Filled 2020-12-07: qty 200
  Filled 2020-12-07 (×5): qty 100
  Filled 2020-12-07: qty 200
  Filled 2020-12-07 (×7): qty 100
  Filled 2020-12-07 (×2): qty 200
  Filled 2020-12-07 (×12): qty 100

## 2020-12-07 MED ORDER — SUCCINYLCHOLINE CHLORIDE 200 MG/10ML IV SOSY
PREFILLED_SYRINGE | INTRAVENOUS | Status: AC
  Filled 2020-12-07: qty 10

## 2020-12-07 MED ORDER — ETOMIDATE 2 MG/ML IV SOLN
INTRAVENOUS | Status: AC
  Filled 2020-12-07: qty 20

## 2020-12-07 MED ORDER — NOREPINEPHRINE 4 MG/250ML-% IV SOLN
INTRAVENOUS | Status: AC
  Administered 2020-12-07: 2 ug/min via INTRAVENOUS
  Filled 2020-12-07: qty 250

## 2020-12-07 MED ORDER — ROCURONIUM BROMIDE 10 MG/ML (PF) SYRINGE
140.0000 mg | PREFILLED_SYRINGE | Freq: Once | INTRAVENOUS | Status: AC
  Administered 2020-12-07: 140 mg via INTRAVENOUS

## 2020-12-07 MED ORDER — GUAIFENESIN 100 MG/5ML PO SOLN: 10.0000 mL | ORAL | Status: DC | PRN

## 2020-12-07 MED ORDER — FENTANYL CITRATE (PF) 100 MCG/2ML IJ SOLN
INTRAMUSCULAR | Status: AC
  Filled 2020-12-07: qty 2

## 2020-12-07 MED ORDER — FENTANYL 2500MCG IN NS 250ML (10MCG/ML) PREMIX INFUSION
50.0000 ug/h | INTRAVENOUS | Status: DC
  Administered 2020-12-07: 50 ug/h via INTRAVENOUS
  Administered 2020-12-08 – 2020-12-09 (×4): 200 ug/h via INTRAVENOUS
  Administered 2020-12-10: 150 ug/h via INTRAVENOUS
  Administered 2020-12-10: 200 ug/h via INTRAVENOUS
  Administered 2020-12-11: 150 ug/h via INTRAVENOUS
  Administered 2020-12-12: 175 ug/h via INTRAVENOUS
  Filled 2020-12-07 (×9): qty 250

## 2020-12-07 MED ORDER — ROCURONIUM BROMIDE 10 MG/ML (PF) SYRINGE
100.0000 mg | PREFILLED_SYRINGE | Freq: Once | INTRAVENOUS | Status: AC
  Administered 2020-12-07: 100 mg via INTRAVENOUS

## 2020-12-07 MED ORDER — SODIUM CHLORIDE 0.9 % IV SOLN: INTRAVENOUS | Status: DC | PRN

## 2020-12-07 MED ORDER — KETAMINE HCL 50 MG/5ML IJ SOSY
PREFILLED_SYRINGE | INTRAMUSCULAR | Status: AC
  Filled 2020-12-07: qty 5

## 2020-12-07 MED ORDER — NOREPINEPHRINE 4 MG/250ML-% IV SOLN
0.0000 ug/min | INTRAVENOUS | Status: DC
  Filled 2020-12-07: qty 250

## 2020-12-07 MED ORDER — FENTANYL BOLUS VIA INFUSION
50.0000 ug | INTRAVENOUS | Status: DC | PRN
  Administered 2020-12-07 (×3): 100 ug via INTRAVENOUS
  Administered 2020-12-07: 50 ug via INTRAVENOUS
  Administered 2020-12-07: 100 ug via INTRAVENOUS
  Administered 2020-12-07: 50 ug via INTRAVENOUS
  Administered 2020-12-08: 100 ug via INTRAVENOUS
  Administered 2020-12-08: 50 ug via INTRAVENOUS
  Administered 2020-12-08: 100 ug via INTRAVENOUS
  Administered 2020-12-08: 50 ug via INTRAVENOUS
  Administered 2020-12-10 (×2): 100 ug via INTRAVENOUS
  Administered 2020-12-10 (×2): 50 ug via INTRAVENOUS
  Administered 2020-12-10: 100 ug via INTRAVENOUS
  Administered 2020-12-11: 50 ug via INTRAVENOUS
  Administered 2020-12-11 (×2): 100 ug via INTRAVENOUS
  Filled 2020-12-07: qty 100

## 2020-12-07 NOTE — Progress Notes (Signed)
Subjective: Postpartum Day 11: Cesarean Delivery in [place], admitted in ICU Shortness of breath - Plan: DG CHEST PORT 1 VIEW, DG CHEST PORT 1 VIEW  SOB (shortness of breath) - Plan: DG CHEST PORT 1 VIEW, DG CHEST PORT 1 VIEW  Acute respiratory failure with hypoxia (HCC) - Plan: DG Chest Port 1 [place], DG Chest Port 1 View  Hypoxia - Plan: DG CHEST PORT 1 VIEW, DG CHEST PORT 1 VIEW  Retained portions of products of conception following delivery without hemorrhage - Plan: US PELVIC COMPLETE WITH TRANSVAGINAL, US PELVIC COMPLETE WITH TRANSVAGINAL  Patient reports incisional pain.    Objective: Vital signs in last 24 hours: Temp:  [97.7 F (36.5 C)-102.4 F (39.1 C)] 100.8 F (38.2 C) (09/26 0912) Pulse Rate:  [91-121] 94 (09/26 0906) Resp:  [25-48] 25 (09/26 0906) BP: (104-151)/(60-97) 139/71 (09/26 0801) SpO2:  [93 %-100 %] 94 % (09/26 0906) FiO2 (%):  [50 %-100 %] 100 % (09/26 0906) Weight:  [756 kg] 129 kg (09/26 0912)  Physical Exam:  General: alert, cooperative, and no distress Lochia: appropriate Uterine Fundus: firm Incision: healing well DVT Evaluation: No evidence of DVT seen on physical exam.  Recent Labs    12/06/20 1013 12/06/20 1515  HGB 10.4* 10.2*  HCT 33.3* 31.7*    Assessment/Plan: Status post Cesarean section. ARDS, possible PNA, worsening pulmonary function, will perform [place] to R/O retained POC after discussion with CC   .  Scheryl Darter 12/07/2020, 10:25 AM

## 2020-12-07 NOTE — Progress Notes (Signed)
PCCM Brief Progress Note  Despite trial of HHFNC to alleviate work of breathing, pt remained tachypneic with RR >=40. She cannot tolerate BiPAP without sedation. This afternoon endorsed becoming more fatigued. Decision made to intubate for acute hypoxic respiratory failure due to severe ARDS, detailed in procedure note from today.  Will target RASS -4 with sedation, attempt to titrate peep then obtain p:f ratio to see if we should prone tonight.  Melanie Burch Pulmonary/Critical Care

## 2020-12-07 NOTE — Plan of Care (Signed)
  Problem: Education: Goal: Knowledge of disease or condition will improve Outcome: Progressing Goal: Knowledge of the prescribed therapeutic regimen will improve Outcome: Progressing   Problem: Fluid Volume: Goal: Peripheral tissue perfusion will improve Outcome: Progressing   Problem: Clinical Measurements: Goal: Complications related to disease process, condition or treatment will be avoided or minimized Outcome: Progressing   Problem: Education: Goal: Knowledge of General Education information will improve Description: Including pain rating scale, medication(s)/side effects and non-pharmacologic comfort measures Outcome: Progressing   Problem: Health Behavior/Discharge Planning: Goal: Ability to manage health-related needs will improve Outcome: Progressing   Problem: Clinical Measurements: Goal: Ability to maintain clinical measurements within normal limits will improve Outcome: Progressing Goal: Will remain free from infection Outcome: Progressing Goal: Diagnostic test results will improve Outcome: Progressing Goal: Respiratory complications will improve Outcome: Progressing Goal: Cardiovascular complication will be avoided Outcome: Progressing   Problem: Activity: Goal: Risk for activity intolerance will decrease Outcome: Progressing   Problem: Nutrition: Goal: Adequate nutrition will be maintained Outcome: Progressing   Problem: Coping: Goal: Level of anxiety will decrease Outcome: Progressing   Problem: Elimination: Goal: Will not experience complications related to bowel motility Outcome: Progressing Goal: Will not experience complications related to urinary retention Outcome: Progressing   Problem: Pain Managment: Goal: General experience of comfort will improve Outcome: Progressing   Problem: Safety: Goal: Ability to remain free from injury will improve Outcome: Progressing   Problem: Skin Integrity: Goal: Risk for impaired skin integrity will  decrease Outcome: Progressing   

## 2020-12-07 NOTE — Procedures (Addendum)
Central Venous Catheter Insertion Procedure Note  Melanie Burch  [id number]  25-Dec-1995  Date:12/07/20  Time:9:35 PM   Provider Performing:Malacai Grantz Celine Mans   Procedure: Insertion of Non-tunneled Central Venous [phone]) with US guidance ([id number])   Indication(s) Medication administration and Difficult access  Consent Risks of the procedure as well as the alternatives and risks of each were explained to the patient and/or caregiver.  Consent for the procedure was obtained and is signed in the bedside chart  Anesthesia Topical only with 1% lidocaine   Timeout Verified patient identification, verified procedure, site/side was marked, verified correct patient position, special equipment/implants available, medications/allergies/relevant history reviewed, required imaging and test results available.  Sterile Technique Maximal sterile technique including full sterile barrier drape, hand hygiene, sterile gown, sterile gloves, mask, hair covering, sterile ultrasound probe cover (if used).  Procedure Description Area of catheter insertion was cleaned with chlorhexidine and draped in sterile fashion.  With real-time ultrasound guidance a central venous catheter was placed into the right internal jugular vein. Nonpulsatile blood flow and easy flushing noted in all ports.  The catheter was sutured in place and sterile dressing applied.  Complications/Tolerance None; patient tolerated the procedure well. Chest X-ray is ordered to verify placement for internal jugular or subclavian cannulation.   Chest x-ray is not ordered for femoral cannulation.  EBL Minimal  Specimen(s) None  ADDENDUM: Post procedure CXR showed line in R axillary vein. See separate procedure note for update (L line pulled back, R line to be removed.  Charge for this procedure note to be deleted.   Rutherford Guys, PA - C  Pulmonary & Critical Care Medicine For pager details, please see AMION or use Epic  chat  After 1900, please call [facility] for cross coverage needs 12/07/2020, 9:35 PM

## 2020-12-07 NOTE — Progress Notes (Signed)
[name] [id number] Admission Data: 12/07/2020 12:55 PM Attending Provider: Adam Phenix, MD  QMV:HQIONGE, No Pcp Per (Inactive) Consults/ Treatment Team:   Melanie Burch is a 25 y.o. female patient admitted from ED awake, alert  & orientated  X 3,  Full Code, VSS - Blood pressure 126/81, pulse (!) 105, temperature 99 F (37.2 C), temperature source Oral, resp. rate (!) 36, height 5\' 4"  (1.626 m), weight 129 kg, SpO2 96 %., O2   25 L Heated High flow [place], , no c/o chest pain, no distress noted. Tele # placed and pt is currently running:normal sinus rhythm.   IV site WDL:  antecubital right, condition patent and no redness with a transparent dsg that's clean dry and intact.   Pt orientation to unit, room and routine. Information packet given to patient/family and safety video watched.  Admission INP armband ID verified with patient/family, and in place. SR up x 2, fall risk assessment complete with Patient and family verbalizing understanding of risks associated with falls. Pt verbalizes an understanding of how to use the call bell and to call for help before getting out of bed.  Skin, clean-dry- intact without evidence of bruising, or skin tears.   No evidence of skin break down noted on exam.     Will cont to monitor and assist as needed.  [id number], RN 12/07/2020 12:55 PM

## 2020-12-07 NOTE — Consult Note (Addendum)
NAMEKitt Minardi, MRN:  [id number], DOB:  Oct 29, 1995, LOS: 2 ADMISSION DATE:  12/05/2020, CONSULTATION DATE:  9/25 REFERRING MD:  Dr. Vergie Living, CHIEF COMPLAINT:  Resp distress   History of Present Illness:  Patient is a 25 year old female with PMH of C-section (10 days ago) [facility] to [facility] on 9/24 with respiratory distress.  Patient states on 9/24 she had sudden onset shortness of breath while she was sleeping.  She states that it felt like chest tightness and unable to take a deep breath.  Worse with exertion and lying flat.  Other symptoms were chills, cough, swelling of BLE, and a headache.  Patient denies chest pain, fever, wheezing, weight changes.  Patient denies any history of asthma or sleep apnea.    Upon arrival to [facility], patient hypoxic and increased respiratory rate.  Increasing O2 requirements now on nasal cannula 8 L with sats lower 90s.  Patient having to sit up in bed to reduce WOB.  No fever.  C-section incision with minimal drainage does not appear to be infected per GYN.  Initial BP 143/81, now 108/66.  CXR shows low lung volumes, cardiomegaly, bilateral edema versus pneumonia.  CTA chest without signs of PE, trace bilateral pleural effusions, extensive airspace consolidations and surrounding groundglass opacity (pulmonary edema versus pneumonia (ABG normal.  COVID flu negative.  EKG normal sinus.  No significant findings on echo.  WBC initially 10.5 now 15.7.  Procalcitonin pending.  BNP 219.  Troponin 9.  Patient given 20 IV Lasix and treated for likely pulmonary edema.  K now 3 and being repleted.  PCCM consulted for respiratory distress.  Pertinent  Medical History   Past Medical History:  Diagnosis Date   GBS bacteriuria 12/05/2020     Significant Hospital Events: Including procedures, antibiotic start and stop dates in addition to other pertinent events   9/25: admitted to [facility] for resp distress, transfer to progressive. Diuresing and started on CAP  coverage. 9/26 transfer to ICU. HHFNC as able while off BiPAP for work of breathing.  Interim History / Subjective:  Febrile and started on CAP coverage.  Still tachypneic with BiPAP and on 13L [place]. She is able to come off BiPAP and maintain saturation however she has increased work of breathing and it is limiting her PO intake.  Objective   Blood pressure 139/71, pulse (!) 110, temperature 99.6 F (37.6 C), temperature source Oral, resp. rate (!) 37, height 5\' 4"  (1.626 m), weight 131.6 kg, SpO2 94 %.    FiO2 (%):  [50 %] 50 %   Intake/Output Summary (Last 24 hours) at 12/07/2020 0805 Last data filed at 12/06/2020 2300 Gross per 24 hour  Intake 1285.37 ml  Output 2750 ml  Net -1464.63 ml   Filed Weights   12/05/20 1941  Weight: 131.6 kg    Examination: General:  mild distress, alert HEENT: dry MM, sclera anicteric Neuro: moving all extremities, follows commands, attentive CV: tachycardic, RR, no murmurs PULM:  diminished bilaterally, tachypneic with some use of accessory muscles on 13L [place] GI: soft, bsx4 hypoactive, NT; c section incision cdi Extremities: warm/dry, BLE 2+ edema  Skin: no rashes or lesions   PCT decreasing BNP lower S pneumo U Ag neg RVP neg MRSA nare neg BCx 9/24 pending Trop 12/05/20 9 HIV neg 08/2020  TTE 12/05/20: EF 60-65%, normal diastole, mildly elevated PASP, dilated IVC with >50% resp variation  12/07/20 DVT BLE 12/06/20: neg   Resolved Hospital Problem list     Assessment &  Plan:  Acute respiratory failure with hypoxia Severe ARDS Pulmonary Edema - improving Sepsis likely due to CAP BMI 49 May be multifactorial in setting of flash pulmonary edema, CAP. CTA chest is negative for acute PE. I'm concerned about her limited PO intake due to work of breathing when she's come off of BiPAP. - transfer to ICU - check ABG - BiPAP at night and with naps, prn for work of breathing - start HHFNC during the day for work of breathing - repeat bedside echo  on arrival to ICU although BNP is low  - consideration of steroids if failing to improve - Continue IV lasix as needed, redosed today, monitor PM chemistry panel - add anaerobic coverage in case retained products/endometritis although abdominal exam benign, discharge unchanged - bronchodilators, CPT - Needs outpatient sleep study   Anemia P: -trend CBC  Best Practice (right click and "Reselect all SmartList Selections" daily)   Diet/type: NPO w/ oral meds DVT prophylaxis: LMWH GI prophylaxis: H2B Lines: N/A Foley:  N/A Code Status:  full code Last date of multidisciplinary goals of care discussion [9/26 discussed plan with patient and family at bedside]  Critical care time: 35 minutes    Laroy Apple Pulmonary/Critical Care    See Amion for personal pager PCCM on call pager [phone] until 7pm. Please call Elink 7p-7a. [phone]

## 2020-12-07 NOTE — TOC Initial Note (Signed)
Transition of Care [facility]) - Initial/Assessment Note    Patient Details  Name: Melanie Burch MRN: [id number] Date of Birth: 02/15/1996  [facility] CM/SW Contact:    Tom-Johnson, Hershal Coria, RN Phone Number: 12/07/2020, 5:02 PM  Clinical Narrative:                 CM spoke with patient and her mother Melanie Burch at bedside. Patient states she lives alone in [place]. Recently had a baby boy 11 days ago via C-section. Mom lives in [place]. Independent with care prior hospitalization. Does not have DME's. Does not have PCP because there was no need per patient. Has three brothers whom are supportive.  Works from home for [employer]. Awaiting PT/OT eval for disposition. Not medically ready for discharge. CM will continue to follow with needs.       Barriers to Discharge: Continued Medical Work up   Patient Goals and CMS Choice Patient states their goals for this hospitalization and ongoing recovery are:: To go home CMS Medicare.gov Compare Post Acute Care list provided to:: Patient    Expected Discharge Plan and Services     Discharge Planning Services: CM Consult   Living arrangements for the past 2 months: Apartment                                      Prior Living Arrangements/Services Living arrangements for the past 2 months: Apartment Lives with:: Self Patient language and need for interpreter reviewed:: Yes Do you feel safe going back to the place where you live?: Yes      Need for Family Participation in Patient Care: Yes (Comment) Care giver support system in place?: Yes (comment)   Criminal Activity/Legal Involvement Pertinent to Current Situation/Hospitalization: No - Comment as needed  Activities of Daily Living Home Assistive Devices/Equipment: None ADL Screening (condition at time of admission) Patient's cognitive ability adequate to safely complete daily activities?: Yes Is the patient deaf or have difficulty  hearing?: No Does the patient have difficulty seeing, even when wearing glasses/contacts?: No Does the patient have difficulty concentrating, remembering, or making decisions?: No Patient able to express need for assistance with ADLs?: Yes Does the patient have difficulty dressing or bathing?: No Independently performs ADLs?: Yes (appropriate for developmental age) Does the patient have difficulty walking or climbing stairs?: No Weakness of Legs: None Weakness of Arms/Hands: None  Permission Sought/Granted Permission sought to share information with : Case Manager, Family Supports Permission granted to share information with : Yes, Verbal Permission Granted              Emotional Assessment Appearance:: Appears stated age Attitude/Demeanor/Rapport: Engaged Affect (typically observed): Accepting, Appropriate, Hopeful Orientation: : Oriented to Self, Oriented to Place, Oriented to  Time, Oriented to Situation Alcohol / Substance Use: Not Applicable    Admission diagnosis:  Severe pre-eclampsia [O14.10] Patient Active Problem List   Diagnosis Date Noted   Acute hypoxemic respiratory failure (HCC) 12/06/2020   Acute pulmonary edema (HCC) 12/06/2020   Hypokalemia 12/06/2020   Postpartum hypertension 12/06/2020   Hyponatremia 12/06/2020   Elevated brain natriuretic peptide (BNP) level 12/06/2020   Hx of cesarean section 12/05/2020   Severe pre-eclampsia 12/05/2020   Wound dehiscence, cesarean 12/05/2020   BMI 40.0-44.9, adult (HCC) 12/05/2020   Obesity in pregnancy 12/05/2020   PCP:  Patient, No Pcp Per (Inactive) Pharmacy:   [id number] [place], [place] -  [address] AT [facility] [address] [place] [address] Phone: [phone] Fax: [phone]  [facility] [address] [place] [address] Phone: [phone] Fax: [phone]  [id number] - [address]  [place], [place] - [address] AT [facility] [address] [place] [address] Phone: [phone] Fax: [phone]     Social Determinants of Health (SDOH) Interventions    Readmission Risk Interventions No flowsheet data found.

## 2020-12-07 NOTE — Procedures (Signed)
Arterial Catheter Insertion Procedure Note  Melanie Burch  [id number]  [birth year]  Date:12/07/20  Time:9:05 PM    Provider Performing: Inez Pilgrim    Procedure: Insertion of Arterial Line (41287) without US guidance  Indication(s) Blood pressure monitoring and/or need for frequent ABGs  Consent Unable to obtain consent due to emergent nature of procedure.  Anesthesia None   Time Out Verified patient identification, verified procedure, site/side was marked, verified correct patient position, special equipment/implants available, medications/allergies/relevant history reviewed, required imaging and test results available.   Sterile Technique Maximal sterile technique including full sterile barrier drape, hand hygiene, sterile gown, sterile gloves, mask, hair covering, sterile ultrasound probe cover (if used).   Procedure Description Area of catheter insertion was cleaned with chlorhexidine and draped in sterile fashion. Without real-time ultrasound guidance an arterial catheter was placed into the right radial artery.  Appropriate arterial tracings confirmed on monitor.     Complications/Tolerance None; patient tolerated the procedure well.   EBL Minimal   Specimen(s) None

## 2020-12-07 NOTE — Progress Notes (Signed)
PCCM Brief Progress Note  Bedside TTE today with normal EF, no pericardial effusion. No evidence of right heart strain. IVC hard to assess with her seated upright.  Laroy Apple Pulmonary/Critical Care

## 2020-12-07 NOTE — Progress Notes (Signed)
CC progress note:  I actively participated in cc of this patient. Briefly 10 days PP and respiratory failure with severe ARDS. Intubated for progressive respiratory failure. I spent additional time at the bedside with nursing, Dr. Thora Lance, respiratory therapy in active ventilator and hemodynamic management. This time is distinct from all procedures.  Additional cc time provided by myself of 45 minutes in addition to what is already documented by Dr. Thora Lance on the same day.   Charlott Holler [facility] 12/07/2020 6:17 PM  Pager: see AMION  If no response to pager , please call critical care on call (see AMION) until 7pm After 7:00 pm call Elink

## 2020-12-07 NOTE — Progress Notes (Signed)
eLink Physician-Brief Progress Note Patient Name: Melanie Burch DOB: Jul 13, 1995 MRN: [id number]   Date of Service  12/07/2020  HPI/Events of Note  Notified of CXR finding post right IJ placement  Tip of RIJ in axillary vein Tip of LIJ in subclavian vein  eICU Interventions  Informed bedside CCM team who is evaluating     Intervention Category Intermediate Interventions: Diagnostic test evaluation  Darl Pikes 12/07/2020, 10:12 PM

## 2020-12-07 NOTE — Procedures (Signed)
Central Venous Catheter Insertion Procedure Note  Melanie Burch  [id number]  06-15-1995  Date:12/07/20  Time:7:38 PM   Provider Performing:Rodert Hinch Judie Petit Thora Lance   Procedure: Insertion of Non-tunneled Central Venous [phone]) with US guidance ([id number])   Indication(s) Difficult access  Consent Risks of the procedure as well as the alternatives and risks of each were explained to the patient and/or caregiver.  Consent for the procedure was obtained and is signed in the bedside chart  Anesthesia Topical only with 1% lidocaine   Timeout Verified patient identification, verified procedure, site/side was marked, verified correct patient position, special equipment/implants available, medications/allergies/relevant history reviewed, required imaging and test results available.  Sterile Technique Maximal sterile technique including full sterile barrier drape, hand hygiene, sterile gown, sterile gloves, mask, hair covering, sterile ultrasound probe cover (if used).  Procedure Description Area of catheter insertion was cleaned with chlorhexidine and draped in sterile fashion.  With real-time ultrasound guidance a central venous catheter was placed into the left internal jugular vein. Nonpulsatile blood flow and easy flushing noted in all ports.  The catheter was sutured in place and sterile dressing applied.  Complications/Tolerance Tip in opposite subclavian vein. Will place right IJ central line then remove left IJ central line.  EBL Minimal  Specimen(s) None

## 2020-12-07 NOTE — Procedures (Signed)
Intubation Procedure Note  Melanie Burch  [id number]  1995/11/25  Date:12/07/20  Time:7:31 PM   Provider Performing:Eriona Kinchen Judie Petit Thora Lance    Procedure: Intubation (31500)  Indication(s) Respiratory Failure  Consent Risks of the procedure as well as the alternatives and risks of each were explained to the patient and/or caregiver.  Consent for the procedure was obtained and is signed in the bedside chart   Anesthesia Versed 1 mg preinduction Etomidate 30 mg Rocuronium 140 mg  Time Out Verified patient identification, verified procedure, site/side was marked, verified correct patient position, special equipment/implants available, medications/allergies/relevant history reviewed, required imaging and test results available.   Sterile Technique Usual hand hygeine, masks, and gloves were used   Procedure Description Patient positioned in bed supine.  Sedation given as noted above.  Patient was intubated with endotracheal tube using Glidescope s4.  View was Grade 2 only posterior commissure .  Number of attempts was  3 .  Colorimetric CO2 detector was consistent with tracheal placement.   Complications/Tolerance First attempt connection between glidescope and monitor was disrupted. Applied BVM with OPA. Second attempt secretions obscured view but suction had become disconnected from wall.  Applied BVM with oral airway again. 3rd attempt grade 2b view after suctioning secretions. After confirming placement with colorimetric CO2 detector, appeared that there was ventilator circuit leak. Disconnected and again applied BVM however there was a tear in BVM leading to oxygen desaturation. Found a new BVM and vent, O2 saturation stabilized.  Chest X-ray is ordered to verify placement.   EBL Minimal   Specimen(s) None

## 2020-12-07 NOTE — Progress Notes (Signed)
PCCM Brief Interval Progress Note  R IJ CVL placed earlier.  Line went well with no resistance or challenges. Post placement CXR revealed tip of line to be in R axillary vein. L IJ CVL placed previously had tip in R subclavian vein.  Discussed with Dr. Thora Lance and we both felt that pulling back L IJ line would be the best option for now then have PICC placed in AM. L IJ line pulled back 6cm. Follow up CXR looks good with tip right at [facility].  Will ask RN to pull R IJ line.  My procedure charge will be deleted and procedure note addended.  PICC Placement in AM.   Rutherford Guys, PA - [facility] [place] [facility] For pager details, please see AMION or use Epic chat  After 1900, please call [facility] for cross coverage needs 12/07/2020, 11:03 PM

## 2020-12-07 NOTE — Progress Notes (Signed)
Patient transferred to [facility] ICU.

## 2020-12-08 DIAGNOSIS — J9601 Acute respiratory failure with hypoxia: Secondary | ICD-10-CM | POA: Diagnosis not present

## 2020-12-08 DIAGNOSIS — R8271 Bacteriuria: Secondary | ICD-10-CM | POA: Diagnosis not present

## 2020-12-08 DIAGNOSIS — Z98891 History of uterine scar from previous surgery: Secondary | ICD-10-CM | POA: Diagnosis not present

## 2020-12-08 DIAGNOSIS — J80 Acute respiratory distress syndrome: Secondary | ICD-10-CM | POA: Diagnosis not present

## 2020-12-08 DIAGNOSIS — Z6841 Body Mass Index (BMI) 40.0 and over, adult: Secondary | ICD-10-CM | POA: Diagnosis not present

## 2020-12-08 LAB — CBC WITH DIFFERENTIAL/PLATELET
Abs Immature Granulocytes: 0.16 10*3/uL — ABNORMAL HIGH (ref 0.00–0.07)
Basophils Absolute: 0 10*3/uL (ref 0.0–0.1)
Basophils Relative: 0 %
Eosinophils Absolute: 0.1 10*3/uL (ref 0.0–0.5)
Eosinophils Relative: 1 %
HCT: 28.7 % — ABNORMAL LOW (ref 36.0–46.0)
Hemoglobin: 8.9 g/dL — ABNORMAL LOW (ref 12.0–15.0)
Immature Granulocytes: 1 %
Lymphocytes Relative: 10 %
Lymphs Abs: 2.6 10*3/uL (ref 0.7–4.0)
MCH: 27.2 pg (ref 26.0–34.0)
MCHC: 31 g/dL (ref 30.0–36.0)
MCV: 87.8 fL (ref 80.0–100.0)
Monocytes Absolute: 1.4 10*3/uL — ABNORMAL HIGH (ref 0.1–1.0)
Monocytes Relative: 6 %
Neutro Abs: 21.2 10*3/uL — ABNORMAL HIGH (ref 1.7–7.7)
Neutrophils Relative %: 82 %
Platelets: 431 10*3/uL — ABNORMAL HIGH (ref 150–400)
RBC: 3.27 MIL/uL — ABNORMAL LOW (ref 3.87–5.11)
RDW: 13.7 % (ref 11.5–15.5)
WBC: 25.6 10*3/uL — ABNORMAL HIGH (ref 4.0–10.5)
nRBC: 0 % (ref 0.0–0.2)

## 2020-12-08 LAB — COMPREHENSIVE METABOLIC PANEL
ALT: 19 U/L (ref 0–44)
AST: 14 U/L — ABNORMAL LOW (ref 15–41)
Albumin: 2.2 g/dL — ABNORMAL LOW (ref 3.5–5.0)
Alkaline Phosphatase: 107 U/L (ref 38–126)
Anion gap: 8 (ref 5–15)
BUN: 8 mg/dL (ref 6–20)
CO2: 25 mmol/L (ref 22–32)
Calcium: 8.2 mg/dL — ABNORMAL LOW (ref 8.9–10.3)
Chloride: 103 mmol/L (ref 98–111)
Creatinine, Ser: 0.85 mg/dL (ref 0.44–1.00)
GFR, Estimated: >60 mL/min (ref >60)
Glucose, Bld: 107 mg/dL — ABNORMAL HIGH (ref 70–99)
Potassium: 4.2 mmol/L (ref 3.5–5.1)
Sodium: 136 mmol/L (ref 135–145)
Total Bilirubin: 0.2 mg/dL — ABNORMAL LOW (ref 0.3–1.2)
Total Protein: 6.4 g/dL — ABNORMAL LOW (ref 6.5–8.1)

## 2020-12-08 LAB — POCT I-STAT 7, (LYTES, BLD GAS, ICA,H+H)
Acid-base deficit: 2 mmol/L (ref 0.0–2.0)
Bicarbonate: 24.3 mmol/L (ref 20.0–28.0)
Calcium, Ion: 1.1 mmol/L — ABNORMAL LOW (ref 1.15–1.40)
HCT: 26 % — ABNORMAL LOW (ref 36.0–46.0)
Hemoglobin: 8.8 g/dL — ABNORMAL LOW (ref 12.0–15.0)
O2 Saturation: 99 %
Patient temperature: 37.7
Potassium: 3.6 mmol/L (ref 3.5–5.1)
Sodium: 141 mmol/L (ref 135–145)
TCO2: 26 mmol/L (ref 22–32)
pCO2 arterial: 52 mmHg — ABNORMAL HIGH (ref 32.0–48.0)
pH, Arterial: 7.28 — ABNORMAL LOW (ref 7.350–7.450)
pO2, Arterial: 139 mmHg — ABNORMAL HIGH (ref 83.0–108.0)

## 2020-12-08 LAB — TRIGLYCERIDES: Triglycerides: 129 mg/dL (ref <150)

## 2020-12-08 LAB — GLUCOSE, CAPILLARY
Glucose-Capillary: 92 mg/dL (ref 70–99)
Glucose-Capillary: 79 mg/dL (ref 70–99)

## 2020-12-08 LAB — MAGNESIUM: Magnesium: 2 mg/dL (ref 1.7–2.4)

## 2020-12-08 LAB — LEGIONELLA PNEUMOPHILA SEROGP 1 UR AG: L. pneumophila Serogp 1 Ur Ag: NEGATIVE

## 2020-12-08 LAB — PHOSPHORUS: Phosphorus: 4.1 mg/dL (ref 2.5–4.6)

## 2020-12-08 MED ORDER — VITAL HIGH PROTEIN PO LIQD
1000.0000 mL | ORAL | Status: DC
  Administered 2020-12-08 – 2020-12-11 (×4): 1000 mL

## 2020-12-08 MED ORDER — SODIUM CHLORIDE 0.9% FLUSH
10.0000 mL | Freq: Two times a day (BID) | INTRAVENOUS | Status: DC
  Administered 2020-12-08 – 2020-12-11 (×8): 10 mL
  Administered 2020-12-12: 30 mL
  Administered 2020-12-12 – 2020-12-14 (×4): 10 mL

## 2020-12-08 MED ORDER — SODIUM CHLORIDE 0.9% FLUSH
10.0000 mL | Freq: Two times a day (BID) | INTRAVENOUS | Status: DC
  Administered 2020-12-08 – 2020-12-13 (×8): 10 mL

## 2020-12-08 MED ORDER — SODIUM CHLORIDE 0.9% FLUSH
10.0000 mL | INTRAVENOUS | Status: DC | PRN
  Administered 2020-12-11 – 2020-12-13 (×2): 10 mL

## 2020-12-08 MED ORDER — PROSOURCE TF PO LIQD
45.0000 mL | Freq: Two times a day (BID) | ORAL | Status: DC
  Administered 2020-12-08 – 2020-12-11 (×8): 45 mL
  Filled 2020-12-08 (×8): qty 45

## 2020-12-08 MED ORDER — SODIUM CHLORIDE 0.9% FLUSH: 10.0000 mL | INTRAVENOUS | Status: DC | PRN

## 2020-12-08 MED ORDER — FUROSEMIDE 10 MG/ML IJ SOLN
80.0000 mg | Freq: Once | INTRAMUSCULAR | Status: AC
  Administered 2020-12-08: 80 mg via INTRAVENOUS
  Filled 2020-12-08 (×2): qty 8

## 2020-12-08 NOTE — Progress Notes (Signed)
RT called to troubleshoot aline.  Line was unable to draw blood and was pulled and replaced by this RT.

## 2020-12-08 NOTE — Progress Notes (Signed)
Peripherally Inserted Central Catheter Placement  The IV Nurse has discussed with the patient and/or persons authorized to consent for the patient, the purpose of this procedure and the potential benefits and risks involved with this procedure.  The benefits include less needle sticks, lab draws from the catheter, and the patient may be discharged home with the catheter. Risks include, but not limited to, infection, bleeding, blood clot (thrombus formation), and puncture of an artery; nerve damage and irregular heartbeat and possibility to perform a PICC exchange if needed/ordered by physician.  Alternatives to this procedure were also discussed.  Bard Power PICC patient education guide, fact sheet on infection prevention and patient information card has been provided to patient /or left at bedside.    PICC Placement Documentation  PICC Triple Lumen 12/08/20 PICC Right Basilic 40 cm 1 cm (Active)  Indication for Insertion or Continuance of Line Prolonged intravenous therapies;Limited venous access - need for IV therapy >5 days (PICC only) 12/08/20 1242  Exposed Catheter (cm) 1 cm 12/08/20 1242  Site Assessment Clean;Dry;Intact 12/08/20 1242  Lumen #1 Status Flushed;Saline locked;Blood return noted 12/08/20 1242  Lumen #2 Status Flushed;Saline locked;Blood return noted 12/08/20 1242  Lumen #3 Status Flushed;Saline locked;Blood return noted 12/08/20 1242  Dressing Type Transparent 12/08/20 1242  Dressing Status Clean;Dry;Intact 12/08/20 1242  Antimicrobial disc in place? Yes 12/08/20 1242  Safety Lock Not Applicable 12/08/20 1242  Line Care Connections checked and tightened 12/08/20 1242  Line Adjustment (NICU/IV Team Only) No 12/08/20 1242  Dressing Intervention New dressing 12/08/20 1242  Dressing Change Due 12/15/20 12/08/20 1242       Tai Skelly, Lajean Manes 12/08/2020, 12:44 PM

## 2020-12-08 NOTE — Progress Notes (Signed)
Initial Nutrition Assessment  DOCUMENTATION CODES:   Not applicable  INTERVENTION:   Initiate tube feeds via OG tube: - Vital High Protein @ 50 ml/hr (1200 ml/day) - ProSource TF 45 ml BID  Tube feeding regimen provides 1280 kcal, 127 grams of protein, and 1003 ml of H2O.   Tube feeding regimen and current propofol provides 2302 total kcal (>100% of needs).  - Continue prenatal MVI per tube daily  NUTRITION DIAGNOSIS:   Increased nutrient needs related to acute illness, post-op healing as evidenced by estimated needs.  GOAL:   Patient will meet greater than or equal to 90% of their needs  MONITOR:   Vent status, Labs, Weight trends, TF tolerance, I & O's  REASON FOR ASSESSMENT:   Ventilator, Consult Enteral/tube feeding initiation and management  ASSESSMENT:   25 year old female who presented on 9/24 with SOB and chest pain. Pt is G1P1001 postpartum from a primary C-section 10 days prior to presentation. Pt admitted with pulmonary edema due to acute hypoxemic respiratory failure and severe pre-eclampsia. Pt developed severe ARDS and required intubation on 9/26.  9/24 - regular diet 9/26 - intubated  Discussed pt with RN and during ICU rounds. Pt with sepsis likely due to CAP vs retained POC. CCM to hold off on paralytics and proning due to improvement with deep sedation alone.  Consult received for tube feeding initiation and management. Pt with OG tube in stomach per abdominal x-ray on 9/26.  Spoke with pt's mother at bedside who reports to with difficulty eating related to N/V for first 6 months of pregnancy. This resolved through the end of pregnancy. Pt's mother states that pt's pre-pregnancy weight was probably ~250 lbs. Reviewed weight history in chart. Weight in July 2021 was 122.5 kg. Will utilize this as as pre-pregnancy weight.  Only one meal completion of 50% documented for dinner meal on 9/25. Suspect pt's PO intake was limited 2/2 SOB and requiring  BiPAP.  Per RN, pt likely to move away from pumping breast milk in the hospital. Pt last pumped yesterday per RN. Per pt's mother, pt's son has been doing well with drinking formula and they will likely continue this.  Patient is currently intubated on ventilator support MV: 6.6 L/min Temp (24hrs), Avg:99.4 F (37.4 C), Min:97.9 F (36.6 C), Max:101.2 F (38.4 C) BP (a-line): 88/50 MAP (a-line): 65  Drips: Propofol: 38.7 ml/hr (provides 1022 kcal daily from lipid) Fentanyl Levophed: off this AM  Medications reviewed and include: colace, IV protonix, miralax, klor-con 40 mEq BID, prenatal MVI, IV abx  Labs reviewed: ionized calcium 1.10, hemoglobin 8.9 CBG's: 92  UOP: 1510 ml x 24 hours I/O's: -3.8 L since admit  NUTRITION - FOCUSED PHYSICAL EXAM:  Flowsheet Row Most Recent Value  Orbital Region No depletion  Upper Arm Region No depletion  Thoracic and Lumbar Region No depletion  Buccal Region Unable to assess  Temple Region No depletion  Clavicle Bone Region No depletion  Clavicle and Acromion Bone Region No depletion  Scapular Bone Region Unable to assess  Dorsal Hand No depletion  Patellar Region No depletion  Anterior Thigh Region No depletion  Posterior Calf Region No depletion  Edema (RD Assessment) Mild  [BUE, BLE]  Hair Reviewed  Eyes Unable to assess  Mouth Unable to assess  Skin Reviewed  Nails Reviewed       Diet Order:   Diet Order             Diet NPO time specified  Diet effective  now                   EDUCATION NEEDS:   No education needs have been identified at this time  Skin:  Skin Assessment: Reviewed RN Assessment (C-section incision)  Last BM:  no documented BM  Height:   Ht Readings from Last 1 Encounters:  12/07/20 5\' 4"  (1.626 m)    Weight:   Wt Readings from Last 1 Encounters:  12/07/20 129 kg    Ideal Body Weight:  54.5 kg  BMI:  Body mass index is 48.82 kg/m.  Estimated Nutritional Needs:   Kcal:   2000-2200  Protein:  110-130 grams  Fluid:  >/= 2.0 L    12/09/20, MS, RD, LDN Inpatient Clinical Dietitian Please see AMiON for contact information.

## 2020-12-08 NOTE — Procedures (Signed)
Bronchoscopy Procedure Note  Melanie Burch  [id number]  [birth year]  Date:12/08/20  Time:11:22 AM   Provider Performing:Sanai Frick S Shearon Stalls   Procedure(s):  Flexible bronchoscopy with bronchial alveolar lavage (69678)  Indication(s) Respiratory failure  Consent Risks of the procedure as well as the alternatives and risks of each were explained to the patient and/or caregiver.  Consent for the procedure was obtained and is signed in the bedside chart  Anesthesia Deep ICU sedation - fentanyl and propofol   Time Out Verified patient identification, verified procedure, site/side was marked, verified correct patient position, special equipment/implants available, medications/allergies/relevant history reviewed, required imaging and test results available.   Sterile Technique Usual hand hygiene, masks, gowns, and gloves were used   Procedure Description Bronchoscope advanced through endotracheal tube and into airway.  Airways were examined down to subsegmental level with findings noted below.   Following diagnostic evaluation, BAL(s) performed in right middle lobe with normal saline and return of 30cc cellular fluid  Findings: Diffusely erythematous endobronchial mucosa. The right tracheobronchial tree was examined without endobronchial lesions. Trace secretions suctioned. ETT was at the level of the carina and pulled back to 22 cm at the teeth. (Previously at 23 cm at teeth.) confirmed supracarinal placement by direct visualization   Complications/Tolerance None; patient tolerated the procedure well. Chest X-ray is not needed post procedure.   EBL none   Specimen(s) BAL - cell count, culture, viral panel

## 2020-12-08 NOTE — Consult Note (Signed)
NAMEMurphy Burch, MRN:  [id number], DOB:  Jul 14, 1995, LOS: 3 ADMISSION DATE:  12/05/2020, CONSULTATION DATE:  9/25 REFERRING MD:  Dr. Ilda Basset, CHIEF COMPLAINT:  Resp distress   History of Present Illness:  Patient is a 25 year old female with PMH of C-section (10 days ago) presents to [facility] on 9/24 with respiratory distress.  Patient states on 9/24 she had sudden onset shortness of breath while she was sleeping.  She states that it felt like chest tightness and unable to take a deep breath.  Worse with exertion and lying flat.  Other symptoms were chills, cough, swelling of BLE, and a headache.  Patient denies chest pain, fever, wheezing, weight changes.  Patient denies any history of asthma or sleep apnea.    Upon arrival to [facility], patient hypoxic and increased respiratory rate.  Increasing O2 requirements now on nasal cannula 8 L with sats lower 90s.  Patient having to sit up in bed to reduce WOB.  No fever.  C-section incision with minimal drainage does not appear to be infected per GYN.  Initial BP 143/81, now 108/66.  CXR shows low lung volumes, cardiomegaly, bilateral edema versus pneumonia.  CTA chest without signs of PE, trace bilateral pleural effusions, extensive airspace consolidations and surrounding groundglass opacity (pulmonary edema versus pneumonia (ABG normal.  COVID flu negative.  EKG normal sinus.  No significant findings on echo.  WBC initially 10.5 now 15.7.  Procalcitonin pending.  BNP 219.  Troponin 9.  Patient given 20 IV Lasix and treated for likely pulmonary edema.  K now 3 and being repleted.  PCCM consulted for respiratory distress.  Pertinent  Medical History   Past Medical History:  Diagnosis Date   GBS bacteriuria 12/05/2020     Significant Hospital Events: Including procedures, antibiotic start and stop dates in addition to other pertinent events   9/25: admitted to [facility] for resp distress, transfer to progressive. Diuresing and started on CAP  coverage. 9/26 transfer to ICU. [place] as able while off BiPAP for work of breathing. 9/26 intubated for worsening tachypnia and respiratory failure  Interim History / Subjective:  Intubated yesterday afternoon. 50 propofol, 200 fentanyl low dose norepinephrine 2 mcgs Uop dropping off.  Given 500L n 50% FiO2 this morning.  P/F ratio 200 Plateau pressure 28 on PEEP 18 Line malpositioned overnight when placed into the subclavian  Objective   Blood pressure (!) 99/53, pulse 83, temperature 97.9 F (36.6 C), temperature source Axillary, resp. rate (!) 32, height _0  (1.626 m), weight 129 kg, SpO2 100 %.    Vent Mode: PRVC FiO2 (%):  [50 %-100 %] 50 % Set Rate:  [20 bmp-28 bmp] 28 bmp Vt Set:  [270 mL-350 mL] 270 mL PEEP:  [18 cmH20] 18 cmH20 Plateau Pressure:  [22 cmH20-25 cmH20] 25 cmH20   Intake/Output Summary (Last 24 hours) at 12/08/2020 0805 Last data filed at 12/08/2020 0618 Gross per 24 hour  Intake 1480.45 ml  Output 1510 ml  Net -29.55 ml   Filed Weights   12/05/20 1941 12/07/20 0912  Weight: 131.6 kg 129 kg    Examination: General: intubated, deeply sedated HEENT: ETT to vent Neuro: deeply sedated CV: tachycardic, RRR PULM:  diminished bilaterally, driving pressure 10 (28-18) GI: soft, bsx4 hypoactive, NT; c section incision cdi Extremities: warm/dry, BLE 2+ edema  Skin: no rashes or lesions   Labs/imaging reviewed: Cr 0.85 Na 136 K 4.2 TVUS showed thickened endometium possibly concerning for POC.   Chest xray bilateral airspace opacities,  central line placed  TTE 12/05/20: EF 60-65%, normal diastole, mildly elevated PASP, dilated IVC with >50% resp variation  US DVT BLE 12/06/20: neg   Resolved Hospital Problem list     Assessment & Plan:  Acute hypoxemic respiratory failure Moderate ARDS BMI 49 Intubated for ARDS Moderate PF ratio 200 Etiology of ARDS likely Pregnant state with reduced FRC, Chorioaminitis, C-section (2 hit hypothesis) Will plan  for BAL today - if PMN predominant and consistent with Cap will start steroids per DEX ARDS Continue goal RASS -4 today Discussed paralytics and proning. Given improvement with deep sedation alone, will hold for now.  Lasix 80 mg IV x1 - Needs outpatient sleep study  - continue LTVV 6 cc/kg, following high PEEP ladder, will wean down PEEP to 12 by end of day today.   Sepsis likely due to CAP vs retained POC - TVUS shows thickened endometrium. Will review with OB - continue ceftriaxone, azithromycin and flagyl - can de-escalate flagyl if TVUS appears reassuring to OB  Circulatory Shock Likely drug related Low dose pressors as needed for MAP>65  Anemia P: -trend CBC  Post Partum Breast Feeding - monitor for signs of engorgement - cold compresses as needed  Best Practice (right click and "Reselect all SmartList Selections" daily)   Diet/type: NPO w/ meds via tube - will start today DVT prophylaxis: LMWH GI prophylaxis: PPI Lines: Central line, Arterial Line, and yes and it is still needed plan for PICC today Foley:  N/A Code Status:  full code Last date of multidisciplinary goals of care discussion [9/27 discussed plan with father at bedside]  Critical care time: 85     The patient is critically ill due to ards, respiratory failure.  Critical care was necessary to treat or prevent imminent or life-threatening deterioration.  Critical care was time spent personally by me on the following activities: development of treatment plan with patient and/or surrogate as well as nursing, discussions with consultants, evaluation of patient's response to treatment, examination of patient, obtaining history from patient or surrogate, ordering and performing treatments and interventions, ordering and review of laboratory studies, ordering and review of radiographic studies, pulse oximetry, re-evaluation of patient's condition and participation in multidisciplinary rounds.   Critical Care Time  devoted to patient care services described in this note is 55 minutes. This time reflects time of care of this Brent . This critical care time does not reflect separately billable procedures or procedure time, teaching time or supervisory time of PA/NP/Med student/Med Resident etc but could involve care discussion time.       Spero Geralds [facility] 12/08/2020 8:06 AM  Pager: see AMION  If no response to pager , please call critical care on call (see AMION) until 7pm After 7:00 pm call Elink

## 2020-12-08 NOTE — Progress Notes (Signed)
Patient ID: Melanie Burch, female   DOB: 02-16-1996, 25 y.o.   MRN: [id number] Subjective: Postpartum Day 12: Cesarean Delivery in [place], admitted in ICU Shortness of breath - Plan: DG CHEST PORT 1 VIEW, DG CHEST PORT 1 VIEW   SOB (shortness of breath) - Plan: DG CHEST PORT 1 VIEW, DG CHEST PORT 1 VIEW   Acute respiratory failure with hypoxia (HCC) - Plan: DG Chest Port 1 [place], DG Chest Port 1 View   Hypoxia - Plan: DG CHEST PORT 1 VIEW, DG CHEST PORT 1 VIEW   Retained portions of products of conception following delivery without hemorrhage - Plan: US PELVIC COMPLETE WITH TRANSVAGINAL, US PELVIC COMPLETE WITH TRANSVAGINAL   Patient reports incisional pain.     Objective: Blood pressure (!) 91/59, pulse 92, temperature 97.9 F (36.6 C), temperature source Axillary, resp. rate 20, height 5\' 4"  (1.626 m), weight 129 kg, SpO2 98 %.    Physical Exam:  General: sedated Lochia: appropriate, no foul discharge, only dark blood. Patient examined with RN present as chaperone Uterine Fundus: firm Incision: healing well DVT Evaluation: No evidence of DVT seen on physical exam.   Recent Labs (last 2 labs)       Recent Labs  CBC    Component Value Date/Time   WBC 25.6 (H) 12/08/2020 0408   RBC 3.27 (L) 12/08/2020 0408   HGB 8.9 (L) 12/08/2020 0408   HCT 28.7 (L) 12/08/2020 0408   PLT 431 (H) 12/08/2020 0408   MCV 87.8 12/08/2020 0408   MCH 27.2 12/08/2020 0408   MCHC 31.0 12/08/2020 0408   RDW 13.7 12/08/2020 0408   LYMPHSABS 2.6 12/08/2020 0408   MONOABS 1.4 (H) 12/08/2020 0408   EOSABS 0.1 12/08/2020 0408   BASOSABS 0.0 12/08/2020 0408                   Narrative & Impression  CLINICAL DATA:  Recent delivery assess for retained products   EXAM: ULTRASOUND PELVIS TRANSVAGINAL   TECHNIQUE: Transvaginal ultrasound examination of the pelvis was performed including evaluation of the uterus, ovaries, adnexal regions, and pelvic cul-de-sac.   COMPARISON:  None.    FINDINGS: Uterus   Measurements: 11.3 x 6.8 x 7.4 cm = volume: 296.1 mL. No fibroids or other mass visualized.   Endometrium   Thickness: 18 mm. Moderate complex fluid in the endometrial canal. No significant vascularity. No focal mass   Right ovary   Not seen   Left ovary   Not seen.   Other findings:  Trace free fluid   IMPRESSION: 1. Endometrial thickness of 18 mm with moderate complex fluid in the endometrial canal, likely blood products. No focal mass or increased vascularity. Findings are nonspecific and could indicate endometrial blood products versus hypovascular retained products of conception. Consider short-term follow-up pelvic ultrasound or further evaluation with pelvic MRI with and without contrast as clinically warranted. 2. Nonvisualized ovaries     Electronically Signed   By: 12/10/2020 M.D.   On: 12/07/2020 22:13     Assessment/Plan: Status post Cesarean section. ARDS, possible PNA, worsening pulmonary function, 12/09/2020 read as equivocal but no definitive POC retained and PE shows no purulent discharge and endometritis is very unlikely as is retained POC. Antibiotic coverage is adequate nevertheless. No indication to perform uterine evacuation.   [place], MD 12/08/2020

## 2020-12-08 NOTE — TOC Progression Note (Signed)
Transition of Care [facility]) - Progression Note    Patient Details  Name: Roopa Graver MRN: [id number] Date of Birth: [birth year]  [facility] of [facility]) CM/SW Contact  Tom-Johnson, Hershal Coria, RN Phone Number: 12/08/2020, 2:15 PM  Clinical Narrative:    Patient is intubated. Gave mother at bedside list of PCP clinics. Requesting clinics around [facility] and [place] area. Patient is sedated and not medically ready for discharge. CM will continue to follow with needs.     Barriers to Discharge: Continued Medical Work up  Expected Discharge Plan and Services     Discharge Planning Services: CM Consult   Living arrangements for the past 2 months: Apartment                                       Social Determinants of Health (SDOH) Interventions    Readmission Risk Interventions No flowsheet data found.

## 2020-12-08 NOTE — Progress Notes (Addendum)
PCCM Brief Progress Note  Frequent dyssynchrony with double triggering and large tidal volumes in setting of deep sedation. May be due to reverse triggering. Does not seem to lead to desaturation however I was unable to get an accurate plateau to guide PEEP titration due to her effort. She was RASS -4 so one time dose of rocuronium given. She was saturating 100% on PRVC 270, RR 26, FiO2 50%, peep 18 her plateau was 30 and driving pressure 12. Would keep on high PEEP ARDS table. Will repeat abg to check p: F ratio.  Melanie Burch Pulmonary/Critical Care    Addendum: P:F ratio 280 with current settings.

## 2020-12-08 NOTE — Procedures (Signed)
Arterial Catheter Insertion Procedure Note  Starlina Sides-Dalton  [id number]  [birth year]  Date:12/08/20  Time:2:05 AM    Provider Performing: Inez Pilgrim    Procedure: Insertion of Arterial Line (07371) without US guidance  Indication(s) Blood pressure monitoring and/or need for frequent ABGs  Consent Unable to obtain consent due to emergent nature of procedure.  Anesthesia None   Time Out Verified patient identification, verified procedure, site/side was marked, verified correct patient position, special equipment/implants available, medications/allergies/relevant history reviewed, required imaging and test results available.   Sterile Technique Maximal sterile technique including full sterile barrier drape, hand hygiene, sterile gown, sterile gloves, mask, hair covering, sterile ultrasound probe cover (if used).   Procedure Description Area of catheter insertion was cleaned with chlorhexidine and draped in sterile fashion. Without real-time ultrasound guidance an arterial catheter was placed into the left radial artery.  Appropriate arterial tracings confirmed on monitor.     Complications/Tolerance None; patient tolerated the procedure well.   EBL Minimal   Specimen(s) None

## 2020-12-08 NOTE — Progress Notes (Signed)
eLink Physician-Brief Progress Note Patient Name: Favor Kreh DOB: May 04, 1995 MRN: [id number]   Date of Service  12/08/2020  HPI/Events of Note  Notified of cloudy urine. Appears pinkish UA yesterday morning only 0-5 WBC and  RBC  eICU Interventions  Change in character of urine possibly due to propofol infusion     Intervention Category Intermediate Interventions: Other:  Darl Pikes 12/08/2020, 3:21 AM

## 2020-12-08 NOTE — Progress Notes (Addendum)
RN noticed engorged breasts.  RN used breat bump on patient for about 15 minutes, RN also used heat compresses. Mother at the bedside. Patient felt relief. About 4 oz total produced and dumped in sink.  RN asked the patient if she wished to continue to pump and she shook her head no. RN asked the patient if she wanted to pump some just for relief and she nodded her head yes.   Will pass this on in report.  Darrold Span

## 2020-12-09 DIAGNOSIS — J80 Acute respiratory distress syndrome: Secondary | ICD-10-CM | POA: Diagnosis not present

## 2020-12-09 DIAGNOSIS — J810 Acute pulmonary edema: Secondary | ICD-10-CM | POA: Diagnosis not present

## 2020-12-09 DIAGNOSIS — J9601 Acute respiratory failure with hypoxia: Secondary | ICD-10-CM | POA: Diagnosis not present

## 2020-12-09 LAB — GLUCOSE, CAPILLARY
Glucose-Capillary: 68 mg/dL — ABNORMAL LOW (ref 70–99)
Glucose-Capillary: 76 mg/dL (ref 70–99)
Glucose-Capillary: 86 mg/dL (ref 70–99)
Glucose-Capillary: 87 mg/dL (ref 70–99)
Glucose-Capillary: 90 mg/dL (ref 70–99)
Glucose-Capillary: 91 mg/dL (ref 70–99)
Glucose-Capillary: 94 mg/dL (ref 70–99)

## 2020-12-09 LAB — POCT I-STAT 7, (LYTES, BLD GAS, ICA,H+H)
Acid-Base Excess: 2 mmol/L (ref 0.0–2.0)
Acid-Base Excess: 2 mmol/L (ref 0.0–2.0)
Acid-Base Excess: 2 mmol/L (ref 0.0–2.0)
Bicarbonate: 27.7 mmol/L (ref 20.0–28.0)
Bicarbonate: 28.5 mmol/L — ABNORMAL HIGH (ref 20.0–28.0)
Bicarbonate: 28.8 mmol/L — ABNORMAL HIGH (ref 20.0–28.0)
Calcium, Ion: 1.21 mmol/L (ref 1.15–1.40)
Calcium, Ion: 1.15 mmol/L (ref 1.15–1.40)
Calcium, Ion: 1.16 mmol/L (ref 1.15–1.40)
HCT: 25 % — ABNORMAL LOW (ref 36.0–46.0)
HCT: 24 % — ABNORMAL LOW (ref 36.0–46.0)
HCT: 25 % — ABNORMAL LOW (ref 36.0–46.0)
Hemoglobin: 8.5 g/dL — ABNORMAL LOW (ref 12.0–15.0)
Hemoglobin: 8.2 g/dL — ABNORMAL LOW (ref 12.0–15.0)
Hemoglobin: 8.5 g/dL — ABNORMAL LOW (ref 12.0–15.0)
O2 Saturation: 96 %
O2 Saturation: 100 %
O2 Saturation: 99 %
Patient temperature: 100.7
Patient temperature: 99.5
Patient temperature: 99.5
Potassium: 4.7 mmol/L (ref 3.5–5.1)
Potassium: 4.4 mmol/L (ref 3.5–5.1)
Potassium: 4.5 mmol/L (ref 3.5–5.1)
Sodium: 137 mmol/L (ref 135–145)
Sodium: 139 mmol/L (ref 135–145)
Sodium: 139 mmol/L (ref 135–145)
TCO2: 29 mmol/L (ref 22–32)
TCO2: 30 mmol/L (ref 22–32)
TCO2: 31 mmol/L (ref 22–32)
pCO2 arterial: 52.6 mmHg — ABNORMAL HIGH (ref 32.0–48.0)
pCO2 arterial: 57.7 mmHg — ABNORMAL HIGH (ref 32.0–48.0)
pCO2 arterial: 57.8 mmHg — ABNORMAL HIGH (ref 32.0–48.0)
pH, Arterial: 7.335 — ABNORMAL LOW (ref 7.350–7.450)
pH, Arterial: 7.305 — ABNORMAL LOW (ref 7.350–7.450)
pH, Arterial: 7.309 — ABNORMAL LOW (ref 7.350–7.450)
pO2, Arterial: 90 mmHg (ref 83.0–108.0)
pO2, Arterial: 178 mmHg — ABNORMAL HIGH (ref 83.0–108.0)
pO2, Arterial: 192 mmHg — ABNORMAL HIGH (ref 83.0–108.0)

## 2020-12-09 LAB — COMPREHENSIVE METABOLIC PANEL
ALT: 15 U/L (ref 0–44)
AST: 15 U/L (ref 15–41)
Albumin: 2.1 g/dL — ABNORMAL LOW (ref 3.5–5.0)
Alkaline Phosphatase: 95 U/L (ref 38–126)
Anion gap: 8 (ref 5–15)
BUN: 11 mg/dL (ref 6–20)
CO2: 23 mmol/L (ref 22–32)
Calcium: 8.1 mg/dL — ABNORMAL LOW (ref 8.9–10.3)
Chloride: 105 mmol/L (ref 98–111)
Creatinine, Ser: 0.69 mg/dL (ref 0.44–1.00)
GFR, Estimated: >60 mL/min (ref >60)
Glucose, Bld: 88 mg/dL (ref 70–99)
Potassium: 4.5 mmol/L (ref 3.5–5.1)
Sodium: 136 mmol/L (ref 135–145)
Total Bilirubin: 0.4 mg/dL (ref 0.3–1.2)
Total Protein: 5.9 g/dL — ABNORMAL LOW (ref 6.5–8.1)

## 2020-12-09 LAB — CBC WITH DIFFERENTIAL/PLATELET
Abs Immature Granulocytes: 0.03 10*3/uL (ref 0.00–0.07)
Basophils Absolute: 0 10*3/uL (ref 0.0–0.1)
Basophils Relative: 0 %
Eosinophils Absolute: 0.5 10*3/uL (ref 0.0–0.5)
Eosinophils Relative: 4 %
HCT: 27.3 % — ABNORMAL LOW (ref 36.0–46.0)
Hemoglobin: 8.4 g/dL — ABNORMAL LOW (ref 12.0–15.0)
Immature Granulocytes: 0 %
Lymphocytes Relative: 25 %
Lymphs Abs: 2.8 10*3/uL (ref 0.7–4.0)
MCH: 27.6 pg (ref 26.0–34.0)
MCHC: 30.8 g/dL (ref 30.0–36.0)
MCV: 89.8 fL (ref 80.0–100.0)
Monocytes Absolute: 0.7 10*3/uL (ref 0.1–1.0)
Monocytes Relative: 6 %
Neutro Abs: 7.3 10*3/uL (ref 1.7–7.7)
Neutrophils Relative %: 65 %
Platelets: 369 10*3/uL (ref 150–400)
RBC: 3.04 MIL/uL — ABNORMAL LOW (ref 3.87–5.11)
RDW: 14 % (ref 11.5–15.5)
WBC: 11.4 10*3/uL — ABNORMAL HIGH (ref 4.0–10.5)
nRBC: 0 % (ref 0.0–0.2)

## 2020-12-09 LAB — MAGNESIUM
Magnesium: 1.9 mg/dL (ref 1.7–2.4)
Magnesium: 1.9 mg/dL (ref 1.7–2.4)

## 2020-12-09 LAB — PHOSPHORUS
Phosphorus: 4.2 mg/dL (ref 2.5–4.6)
Phosphorus: 5.1 mg/dL — ABNORMAL HIGH (ref 2.5–4.6)

## 2020-12-09 MED ORDER — PANTOPRAZOLE 2 MG/ML SUSPENSION
40.0000 mg | Freq: Every day | ORAL | Status: DC
  Administered 2020-12-09 – 2020-12-11 (×3): 40 mg
  Filled 2020-12-09 (×3): qty 20

## 2020-12-09 MED ORDER — DEXTROSE 50 % IV SOLN
25.0000 mL | Freq: Once | INTRAVENOUS | Status: AC
  Administered 2020-12-09: 25 mL via INTRAVENOUS

## 2020-12-09 MED ORDER — DEXTROSE 50 % IV SOLN
INTRAVENOUS | Status: AC
  Filled 2020-12-09: qty 50

## 2020-12-09 MED ORDER — FUROSEMIDE 10 MG/ML IJ SOLN
80.0000 mg | Freq: Once | INTRAMUSCULAR | Status: AC
  Administered 2020-12-09: 80 mg via INTRAVENOUS
  Filled 2020-12-09: qty 8

## 2020-12-09 MED ORDER — MIDAZOLAM-SODIUM CHLORIDE 100-0.9 MG/100ML-% IV SOLN
0.0000 mg/h | INTRAVENOUS | Status: DC
  Administered 2020-12-09: 2 mg/h via INTRAVENOUS
  Filled 2020-12-09: qty 100

## 2020-12-09 MED ORDER — MIDAZOLAM BOLUS VIA INFUSION
0.0000 mg | INTRAVENOUS | Status: DC | PRN
  Administered 2020-12-09: 1 mg via INTRAVENOUS
  Administered 2020-12-09 (×2): 2 mg via INTRAVENOUS
  Filled 2020-12-09: qty 5

## 2020-12-09 NOTE — Lactation Note (Addendum)
LC received a call from Dr. Debroah Loop to assist in drying up milk supply of patient intubated on respirator since 9/26. RN last pumped Mom one day prior getting 4 ounces of milk.   On arrival, breast moderate engorgement. With right more pronounced than left. RN, Nena Polio, assisted me with icing patient prior to my arrival for about 20 minutes. We then worked together using coconut oil and reveres pressure to hand express. Moist heat applied and we pumped off 28 ml from one breast and 20 ml from the other.  Breast soft after pumping and breast massage with moist heat ( LC increased flange size from 27 to 30 getting pumped volumes listed above) Relative bought cabbage leaves, applied with crushing of spines using the bra to hold them in place.  RN to repeat once leaves wilted.  Written protocol to prevent further engorgement provided.   RN to repeat use of cabbage leaves can take up to 2 days for milk supply to dry up.  RN to observe breasts to ensure breast remain soft.   All questions answered at the end of the visit.

## 2020-12-09 NOTE — Progress Notes (Signed)
Subjective: Postpartum Day 13: Cesarean Delivery Patient is sedated due to difficulty with the ventilator   Objective: Vital signs in last 24 hours: Temp:  [98.8 F (37.1 C)-101.5 F (38.6 C)] 98.8 F (37.1 C) (09/28 1542) Pulse Rate:  [92-110] 92 (09/28 1544) Resp:  [13-32] 28 (09/28 1544) BP: (89-119)/(42-65) 119/65 (09/28 1544) SpO2:  [100 %] 100 % (09/28 1544) Arterial Line BP: (85-153)/(51-121) 123/59 (09/28 1400) FiO2 (%):  [45 %-60 %] 45 % (09/28 1544) Weight:  [129.9 kg] 129.9 kg (09/28 0518)  Physical Exam:  General:  sedated Lochia: appropriate Incision: healing well, no significant drainage   Recent Labs    12/09/20 1056 12/09/20 1105  HGB 8.2* 8.5*  HCT 24.0* 25.0*   CBC    Component Value Date/Time   WBC 11.4 (H) 12/09/2020 0350   RBC 3.04 (L) 12/09/2020 0350   HGB 8.5 (L) 12/09/2020 1105   HCT 25.0 (L) 12/09/2020 1105   PLT 369 12/09/2020 0350   MCV 89.8 12/09/2020 0350   MCH 27.6 12/09/2020 0350   MCHC 30.8 12/09/2020 0350   RDW 14.0 12/09/2020 0350   LYMPHSABS 2.8 12/09/2020 0350   MONOABS 0.7 12/09/2020 0350   EOSABS 0.5 12/09/2020 0350   BASOSABS 0.0 12/09/2020 0350    Assessment/Plan: Status post Cesarean section. Shortness of breath - Plan: DG CHEST PORT 1 VIEW, DG CHEST PORT 1 VIEW  SOB (shortness of breath) - Plan: DG CHEST PORT 1 VIEW, DG CHEST PORT 1 VIEW  Acute respiratory failure with hypoxia (HCC) - Plan: DG Chest Port 1 [place], DG Chest Port 1 View  Hypoxia - Plan: DG CHEST PORT 1 VIEW, DG CHEST PORT 1 VIEW  Retained portions of products of conception following delivery without hemorrhage - Plan: US PELVIS TRANSVAGINAL NON-OB (TV ONLY), US PELVIS TRANSVAGINAL NON-OB (TV ONLY), CANCELED: US PELVIC COMPLETE WITH TRANSVAGINAL, CANCELED: US PELVIC COMPLETE WITH TRANSVAGINAL  Encounter for intravenous line placement - Plan: DG CHEST PORT 1 VIEW, DG CHEST PORT 1 VIEW  Encounter for imaging study to confirm orogastric (OG) tube  placement - Plan: DG Abd 1 View, DG Abd 1 View  Encounter for central line placement - Plan: DG CHEST PORT 1 VIEW, DG CHEST PORT 1 VIEW, DG Chest Port 1 [place], DG Chest Port 1 View  Lactation consult  Scheryl Darter 12/09/2020, 4:36 PM

## 2020-12-09 NOTE — Progress Notes (Signed)
NAMEAiris Burch, MRN:  [id number], DOB:  [birth year], LOS: 4 ADMISSION DATE:  12/05/2020, CONSULTATION DATE:  9/25 REFERRING MD:  Dr. Ilda Basset, CHIEF COMPLAINT:  Resp distress   History of Present Illness:  Patient is a 25 year old female with PMH of C-section (10 days ago) presents to [facility] on 9/24 with respiratory distress.  Patient states on 9/24 she had sudden onset shortness of breath while she was sleeping.  She states that it felt like chest tightness and unable to take a deep breath.  Worse with exertion and lying flat.  Other symptoms were chills, cough, swelling of BLE, and a headache.  Patient denies chest pain, fever, wheezing, weight changes.  Patient denies any history of asthma or sleep apnea.    Upon arrival to [facility], patient hypoxic and increased respiratory rate.  Increasing O2 requirements now on nasal cannula 8 L with sats lower 90s.  Patient having to sit up in bed to reduce WOB.  No fever.  C-section incision with minimal drainage does not appear to be infected per GYN.  Initial BP 143/81, now 108/66.  CXR shows low lung volumes, cardiomegaly, bilateral edema versus pneumonia.  CTA chest without signs of PE, trace bilateral pleural effusions, extensive airspace consolidations and surrounding groundglass opacity (pulmonary edema versus pneumonia (ABG normal.  COVID flu negative.  EKG normal sinus.  No significant findings on echo.  WBC initially 10.5 now 15.7.  Procalcitonin pending.  BNP 219.  Troponin 9.  Patient given 20 IV Lasix and treated for likely pulmonary edema.  K now 3 and being repleted.  PCCM consulted for respiratory distress.  Pertinent  Medical History   Past Medical History:  Diagnosis Date   GBS bacteriuria 12/05/2020     Significant Hospital Events: Including procedures, antibiotic start and stop dates in addition to other pertinent events   9/25: admitted to [facility] for resp distress, transfer to progressive. Diuresing and started on CAP  coverage. 9/26 transfer to ICU. [place] as able while off BiPAP for work of breathing. 9/26 intubated for worsening tachypnia and respiratory failure 9/27 bronchscopy with BAL done  Interim History / Subjective:  On 40 propofol, 200 fentanyl, RASS -2. Having ventilator dyssynchrony.   Objective   Blood pressure (!) 96/51, pulse 98, temperature 99.5 F (37.5 C), temperature source Axillary, resp. rate (!) 32, height _0  (1.626 m), weight 129.9 kg, SpO2 100 %.    Vent Mode: PRVC FiO2 (%):  [60 %] 60 % Set Rate:  [28 bmp] 28 bmp Vt Set:  [270 mL] 270 mL PEEP:  [12 cmH20-14 cmH20] 12 cmH20 Plateau Pressure:  [15 cmH20-19 cmH20] 19 cmH20   Intake/Output Summary (Last 24 hours) at 12/09/2020 0933 Last data filed at 12/09/2020 0900 Gross per 24 hour  Intake 3119.51 ml  Output 1940 ml  Net 1179.51 ml   Filed Weights   12/05/20 1941 12/07/20 0912 12/09/20 0518  Weight: 131.6 kg 129 kg 129.9 kg    Examination: General: intubated, deeply sedated, arouses to verbal stimuli HEENT: ETT to vent Neuro: arouses to verbal stimuli, can follow commands intermittently CV: regular, tachycardic PULM: diminished, no wheezes or crackles GI: soft, bsx4 hypoactive, NT; c section incision cdi Extremities: warm, dry 2+ edema Skin: no rashes or lesions   Labs/imaging reviewed: Cr 0.69 Na 136 K 4.5 WBC 11.4 Hgb 8.4 Sputum cx pending BAL pending  Resolved Hospital Problem list     Assessment & Plan:  Acute hypoxemic respiratory failure Moderate ARDS BMI 49 Intubated  for ARDS Moderate PF ratio 200 Etiology of ARDS likely Pregnant state with reduced FRC, Chorioaminitis, C-section (2 hit hypothesis) BAL sent yesterday - cell count not performed. Called lab and they will perform now. Likely too late in ARDS process for DEX ARDS protocol steroids.  Continue goal RASS -4 today. She is not achieving this with existing sedation. Will add versed and get ABG after more syncchronous Discussed  paralytics and proning. Given improvement with deep sedation alone, will hold for now.  Lasix 80 mg IV x1 today - Needs outpatient sleep study  - continue LTVV 6 cc/kg, following high PEEP ladder  Sepsis likely due to CAP - TVUS shows thickened endometrium but no retained POC - continue ceftriaxone, azithromycin and flagyl for now. Still low grade fevers  Circulatory Shock Likely drug related Low dose pressors as needed for MAP>65  Anemia P: -trend CBC  Post Partum Breast Feeding - monitor for signs of engorgement - cold compresses as needed, pump as needed   Best Practice (right click and "Reselect all SmartList Selections" daily)   Diet/type: NPO w/ meds via tube DVT prophylaxis: LMWH GI prophylaxis: PPI Lines: Arterial Line PICC Foley:  N/A Code Status:  full code Last date of multidisciplinary goals of care discussion [9/28 discussed plan with father at bedside]  Critical care time: 15     The patient is critically ill due to ards, respiratory failure.  Critical care was necessary to treat or prevent imminent or life-threatening deterioration.  Critical care was time spent personally by me on the following activities: development of treatment plan with patient and/or surrogate as well as nursing, discussions with consultants, evaluation of patient's response to treatment, examination of patient, obtaining history from patient or surrogate, ordering and performing treatments and interventions, ordering and review of laboratory studies, ordering and review of radiographic studies, pulse oximetry, re-evaluation of patient's condition and participation in multidisciplinary rounds.   Critical Care Time devoted to patient care services described in this note is 37 minutes. This time reflects time of care of this [place] . This critical care time does not reflect separately billable procedures or procedure time, teaching time or supervisory time of PA/NP/Med  student/Med Resident etc but could involve care discussion time.       Spero Geralds [place] Pulmonary and Critical Care Medicine 12/09/2020 9:33 AM  Pager: see AMION  If no response to pager , please call critical care on call (see AMION) until 7pm After 7:00 pm call Elink

## 2020-12-09 NOTE — Progress Notes (Addendum)
Ice applied to bilateral breast to help with engorgement.  No leaking noted.  Lactation consult to come with additional advice.  Patient Patient resting comfortable

## 2020-12-09 NOTE — Progress Notes (Addendum)
eLink Physician-Brief Progress Note Patient Name: Melanie Burch DOB: 1995/11/03 MRN: [id number]   Date of Service  12/09/2020  HPI/Events of Note  Notified by RN that the last ABG was 12/08/20. Pt is on 60% FiO2, PEEP 12.  eICU Interventions  Check ABG now.      Intervention Category Minor Interventions: Other:  Larinda Buttery 12/09/2020, 1:00 AM  2:06 AM ABG 7.35/50/84 on rate 28, 270, 60%, PEEP 12.   Plan> Continue on current vent settings.  PF ratio remains at 140.

## 2020-12-10 ENCOUNTER — Inpatient Hospital Stay (HOSPITAL_COMMUNITY): Payer: 59

## 2020-12-10 DIAGNOSIS — J80 Acute respiratory distress syndrome: Secondary | ICD-10-CM | POA: Diagnosis not present

## 2020-12-10 DIAGNOSIS — O1413 Severe pre-eclampsia, third trimester: Secondary | ICD-10-CM | POA: Diagnosis not present

## 2020-12-10 DIAGNOSIS — O411231 Chorioamnionitis, third trimester, fetus 1: Secondary | ICD-10-CM | POA: Diagnosis not present

## 2020-12-10 DIAGNOSIS — J9601 Acute respiratory failure with hypoxia: Secondary | ICD-10-CM | POA: Diagnosis not present

## 2020-12-10 LAB — BASIC METABOLIC PANEL
Anion gap: 7 (ref 5–15)
BUN: 12 mg/dL (ref 6–20)
CO2: 30 mmol/L (ref 22–32)
Calcium: 8.6 mg/dL — ABNORMAL LOW (ref 8.9–10.3)
Chloride: 97 mmol/L — ABNORMAL LOW (ref 98–111)
Creatinine, Ser: 0.49 mg/dL (ref 0.44–1.00)
GFR, Estimated: >60 mL/min (ref >60)
Glucose, Bld: 160 mg/dL — ABNORMAL HIGH (ref 70–99)
Potassium: 4.4 mmol/L (ref 3.5–5.1)
Sodium: 134 mmol/L — ABNORMAL LOW (ref 135–145)

## 2020-12-10 LAB — PHOSPHORUS: Phosphorus: 3.8 mg/dL (ref 2.5–4.6)

## 2020-12-10 LAB — CBC WITH DIFFERENTIAL/PLATELET
Abs Immature Granulocytes: 0.01 10*3/uL (ref 0.00–0.07)
Basophils Absolute: 0 10*3/uL (ref 0.0–0.1)
Basophils Relative: 1 %
Eosinophils Absolute: 0.4 10*3/uL (ref 0.0–0.5)
Eosinophils Relative: 6 %
HCT: 25.7 % — ABNORMAL LOW (ref 36.0–46.0)
Hemoglobin: 7.5 g/dL — ABNORMAL LOW (ref 12.0–15.0)
Immature Granulocytes: 0 %
Lymphocytes Relative: 38 %
Lymphs Abs: 2.3 10*3/uL (ref 0.7–4.0)
MCH: 26.2 pg (ref 26.0–34.0)
MCHC: 29.2 g/dL — ABNORMAL LOW (ref 30.0–36.0)
MCV: 89.9 fL (ref 80.0–100.0)
Monocytes Absolute: 0.4 10*3/uL (ref 0.1–1.0)
Monocytes Relative: 6 %
Neutro Abs: 3 10*3/uL (ref 1.7–7.7)
Neutrophils Relative %: 49 %
Platelets: 360 10*3/uL (ref 150–400)
RBC: 2.86 MIL/uL — ABNORMAL LOW (ref 3.87–5.11)
RDW: 13.8 % (ref 11.5–15.5)
WBC: 6.1 10*3/uL (ref 4.0–10.5)
nRBC: 0 % (ref 0.0–0.2)

## 2020-12-10 LAB — BODY FLUID CELL COUNT WITH DIFFERENTIAL
Eos, Fluid: 1 %
Lymphs, Fluid: 10 %
Monocyte-Macrophage-Serous Fluid: 4 % — ABNORMAL LOW (ref 50–90)
Neutrophil Count, Fluid: 85 % — ABNORMAL HIGH (ref 0–25)
Total Nucleated Cell Count, Fluid: 445 cu mm (ref 0–1000)

## 2020-12-10 LAB — GLUCOSE, CAPILLARY
Glucose-Capillary: 100 mg/dL — ABNORMAL HIGH (ref 70–99)
Glucose-Capillary: 102 mg/dL — ABNORMAL HIGH (ref 70–99)
Glucose-Capillary: 104 mg/dL — ABNORMAL HIGH (ref 70–99)
Glucose-Capillary: 70 mg/dL (ref 70–99)
Glucose-Capillary: 76 mg/dL (ref 70–99)
Glucose-Capillary: 91 mg/dL (ref 70–99)
Glucose-Capillary: 95 mg/dL (ref 70–99)

## 2020-12-10 LAB — CULTURE, RESPIRATORY W GRAM STAIN
Culture: NO GROWTH
Gram Stain: NONE SEEN

## 2020-12-10 LAB — MAGNESIUM
Magnesium: 2 mg/dL (ref 1.7–2.4)
Magnesium: 1.7 mg/dL (ref 1.7–2.4)

## 2020-12-10 MED ORDER — DEXTROSE 10 % IV SOLN: INTRAVENOUS | Status: DC

## 2020-12-10 MED ORDER — MAGNESIUM SULFATE 2 GM/50ML IV SOLN
2.0000 g | Freq: Once | INTRAVENOUS | Status: AC
  Administered 2020-12-10: 2 g via INTRAVENOUS
  Filled 2020-12-10: qty 50

## 2020-12-10 MED ORDER — POTASSIUM CHLORIDE 20 MEQ PO PACK
40.0000 meq | PACK | Freq: Once | ORAL | Status: AC
  Administered 2020-12-10: 40 meq
  Filled 2020-12-10: qty 2

## 2020-12-10 MED ORDER — FUROSEMIDE 10 MG/ML IJ SOLN
80.0000 mg | Freq: Once | INTRAMUSCULAR | Status: AC
  Administered 2020-12-10: 80 mg via INTRAVENOUS
  Filled 2020-12-10: qty 8

## 2020-12-10 NOTE — Progress Notes (Signed)
Subjective: Postpartum Day 14: Cesarean Delivery Patient is sedated due to difficulty with the ventilator   Objective: Vital signs in last 24 hours: Temp:  [97.3 F (36.3 C)-100.9 F (38.3 C)] 97.3 F (36.3 C) (09/29 1208) Pulse Rate:  [73-98] 79 (09/29 1200) Resp:  [14-31] 19 (09/29 1200) BP: (100-128)/(46-69) 128/55 (09/29 1200) SpO2:  [83 %-100 %] 100 % (09/29 1200) Arterial Line BP: (88-142)/(49-96) 96/66 (09/29 0800) FiO2 (%):  [40 %-45 %] 40 % (09/29 1208) Weight:  [127.6 kg] 127.6 kg (09/29 0500)  Physical Exam:  General:  sedated Lochia: appropriate Uterine Fundus: firm Incision: healing well   Recent Labs    12/09/20 1105 12/10/20 0448  HGB 8.5* 7.5*  HCT 25.0* 25.7*    Assessment/Plan: Status post Cesarean section.   Care per ICU team, lactation consult was appreciated to help with breast engorgement  Scheryl Darter 12/10/2020, 12:44 PM

## 2020-12-10 NOTE — Progress Notes (Addendum)
NAMEFidencia Burch, [name], DOB:  1995/12/16, LOS: 5 ADMISSION DATE:  12/05/2020, CONSULTATION DATE:  9/25 REFERRING MD:  Dr. Ilda Basset, CHIEF COMPLAINT:  Resp distress   History of Present Illness:  Patient is a 25 year old female with PMH of C-section (10 days ago) presents to [facility] on 9/24 with respiratory distress.  Patient states on 9/24 she had sudden onset shortness of breath while she was sleeping.  She states that it felt like chest tightness and unable to take a deep breath.  Worse with exertion and lying flat.  Other symptoms were chills, cough, swelling of BLE, and a headache.  Patient denies chest pain, fever, wheezing, weight changes.  Patient denies any history of asthma or sleep apnea.    Upon arrival to [facility], patient hypoxic and increased respiratory rate.  Increasing O2 requirements now on nasal cannula 8 L with sats lower 90s.  Patient having to sit up in bed to reduce WOB.  No fever.  C-section incision with minimal drainage does not appear to be infected per GYN.  Initial BP 143/81, now 108/66.  CXR shows low lung volumes, cardiomegaly, bilateral edema versus pneumonia.  CTA chest without signs of PE, trace bilateral pleural effusions, extensive airspace consolidations and surrounding groundglass opacity (pulmonary edema versus pneumonia (ABG normal.  COVID flu negative.  EKG normal sinus.  No significant findings on echo.  WBC initially 10.5 now 15.7.  Procalcitonin pending.  BNP 219.  Troponin 9.  Patient given 20 IV Lasix and treated for likely pulmonary edema.  K now 3 and being repleted.  PCCM consulted for respiratory distress.  Pertinent  Medical History   Past Medical History:  Diagnosis Date   GBS bacteriuria 12/05/2020     Significant Hospital Events: Including procedures, antibiotic start and stop dates in addition to other pertinent events   9/25: admitted to [facility] for resp distress, transfer to progressive. Diuresing and started on CAP  coverage. 9/26 transfer to ICU. [place] as able while off BiPAP for work of breathing. 9/26 intubated for worsening tachypnia and respiratory failure 9/27 bronchscopy with BAL done 9/28 was very awake on propfol and fentanyl, versed added for deep sedation  Interim History / Subjective:  Low blood sugars last night despite tube feeds running at goal. Synchronous with vent.   Objective   Blood pressure (!) 100/46, pulse 73, temperature 99.4 F (37.4 C), temperature source Oral, resp. rate (!) 28, height _0  (1.626 m), weight 127.6 kg, SpO2 100 %.    Vent Mode: PRVC FiO2 (%):  [45 %-60 %] 45 % Set Rate:  [28 bmp] 28 bmp Vt Set:  [270 mL-320 mL] 320 mL PEEP:  [10 ANV91-66 cmH20] 10 cmH20 Plateau Pressure:  [21 cmH20-24 cmH20] 24 cmH20   Intake/Output Summary (Last 24 hours) at 12/10/2020 0726 Last data filed at 12/10/2020 0600 Gross per 24 hour  Intake 3350.74 ml  Output 4275 ml  Net -924.26 ml   Filed Weights   12/07/20 0912 12/09/20 0518 12/10/20 0500  Weight: 129 kg 129.9 kg 127.6 kg    Examination: General: intubated, deeply sedated HEENT: ETT to vent Neuro: RASS -4 CV: regular, tachycardic PULM: diminished, no wheezes or crackles GI: soft, bsx4 hypoactive, NT; c section incision cdi Extremities: warm, edema improved Skin: no rashes or lesions   Labs/imaging reviewed: WBC 6.1 Hgb 7.5, Phos 3.8 Sputum culture ngtd Blood cx ngtd  Resolved Hospital Problem list     Assessment & Plan:  Acute hypoxemic respiratory failure  Moderate ARDS BMI 49 Intubated for ARDS Moderate PF ratio 200 Etiology of ARDS likely Pregnant state with reduced FRC, Chorioaminitis, C-section (2 hit hypothesis) BAL sent 7/27 - cell count not performed. Called lab 7/28 and they said they would send. Still pending. Looks like lab lost the sample.   - Hold lasix today - abx as below - Needs outpatient sleep study  - continue LTVV 6 cc/kg, following high PEEP ladder - P/F ratio improving, now  296.  I think we can stop versed today. Start lightening sedation. Hope for extubation Friday.   Sepsis likely due to CAP - TVUS shows thickened endometrium but no retained POC - continue ceftriaxone, azithromycin empiric 5 day course.  - can stop flagyl, respiratory cultures no growth to date.   Circulatory Shock Likely drug related Low dose pressors as needed for MAP>65  Anemia P: -trend CBC  Post Partum Breast Feeding - monitor for signs of engorgement - cold compresses as needed, pump as needed  Hypoglycemia - monitor CBG - TF at goal - titrate down D10 as able  Best Practice (right click and "Reselect all SmartList Selections" daily)   Diet/type: NPO w/ meds via tube DVT prophylaxis: LMWH GI prophylaxis: H2B Lines: Arterial Line PICC Foley:  N/A Code Status:  full code Last date of multidisciplinary goals of care discussion [9/28 discussed plan with father at bedside]  Critical care time: 38 min     The patient is critically ill due to ards, respiratory failure.  Critical care was necessary to treat or prevent imminent or life-threatening deterioration.  Critical care was time spent personally by me on the following activities: development of treatment plan with patient and/or surrogate as well as nursing, discussions with consultants, evaluation of patient's response to treatment, examination of patient, obtaining history from patient or surrogate, ordering and performing treatments and interventions, ordering and review of laboratory studies, ordering and review of radiographic studies, pulse oximetry, re-evaluation of patient's condition and participation in multidisciplinary rounds.   Critical Care Time devoted to patient care services described in this note is 38 minutes. This time reflects time of care of this [place] . This critical care time does not reflect separately billable procedures or procedure time, teaching time or supervisory time of  PA/NP/Med student/Med Resident etc but could involve care discussion time.       Spero Geralds [name] 12/10/2020 7:26 AM  Pager: see AMION  If no response to pager , please call critical care on call (see AMION) until 7pm After 7:00 pm call Elink

## 2020-12-10 NOTE — Progress Notes (Signed)
eLink Physician-Brief Progress Note Patient Name: Melanie Burch DOB: 05/30/1995 MRN: [id number]   Date of Service  12/10/2020  HPI/Events of Note  Hypoglycemia - Several episodes. Blood glucose = 76, 68 and 70. Rx with D50. Not on insulin. Tube feeds running.   eICU Interventions  Plan: D10W IV infusion to run IV at 40 mL/hour.      Intervention Category Major Interventions: Other:  Lenell Antu 12/10/2020, 3:59 AM

## 2020-12-10 NOTE — Progress Notes (Signed)
eLink Physician-Brief Progress Note Patient Name: Apryl Brymer DOB: 1995/09/02 MRN: [id number]   Date of Service  12/10/2020  HPI/Events of Note  Patient on scheduled Lasix and K+. K+ level not cheched since 12/09/2020.  eICU Interventions  Plan: BMP STAT.     Intervention Category Major Interventions: Other:  Lenell Antu 12/10/2020, 9:40 PM

## 2020-12-10 NOTE — Progress Notes (Signed)
Pt placed back on full vent support due to tachypnea, and increased WOB, by MD. Pt tolerating well, RN aware, vitals stable, RT will continue to monitor .

## 2020-12-11 ENCOUNTER — Inpatient Hospital Stay (HOSPITAL_COMMUNITY): Payer: 59

## 2020-12-11 DIAGNOSIS — J9601 Acute respiratory failure with hypoxia: Secondary | ICD-10-CM | POA: Diagnosis not present

## 2020-12-11 LAB — BASIC METABOLIC PANEL
Anion gap: 8 (ref 5–15)
BUN: 13 mg/dL (ref 6–20)
CO2: 30 mmol/L (ref 22–32)
Calcium: 8.8 mg/dL — ABNORMAL LOW (ref 8.9–10.3)
Chloride: 98 mmol/L (ref 98–111)
Creatinine, Ser: 0.52 mg/dL (ref 0.44–1.00)
GFR, Estimated: >60 mL/min (ref >60)
Glucose, Bld: 188 mg/dL — ABNORMAL HIGH (ref 70–99)
Potassium: 4 mmol/L (ref 3.5–5.1)
Sodium: 136 mmol/L (ref 135–145)

## 2020-12-11 LAB — CBC WITH DIFFERENTIAL/PLATELET
Abs Immature Granulocytes: 0.01 10*3/uL (ref 0.00–0.07)
Basophils Absolute: 0 10*3/uL (ref 0.0–0.1)
Basophils Relative: 1 %
Eosinophils Absolute: 0.4 10*3/uL (ref 0.0–0.5)
Eosinophils Relative: 6 %
HCT: 28.4 % — ABNORMAL LOW (ref 36.0–46.0)
Hemoglobin: 8.6 g/dL — ABNORMAL LOW (ref 12.0–15.0)
Immature Granulocytes: 0 %
Lymphocytes Relative: 35 %
Lymphs Abs: 2.2 10*3/uL (ref 0.7–4.0)
MCH: 27.1 pg (ref 26.0–34.0)
MCHC: 30.3 g/dL (ref 30.0–36.0)
MCV: 89.6 fL (ref 80.0–100.0)
Monocytes Absolute: 0.4 10*3/uL (ref 0.1–1.0)
Monocytes Relative: 7 %
Neutro Abs: 3.2 10*3/uL (ref 1.7–7.7)
Neutrophils Relative %: 51 %
Platelets: 418 10*3/uL — ABNORMAL HIGH (ref 150–400)
RBC: 3.17 MIL/uL — ABNORMAL LOW (ref 3.87–5.11)
RDW: 13.7 % (ref 11.5–15.5)
WBC: 6.2 10*3/uL (ref 4.0–10.5)
nRBC: 0 % (ref 0.0–0.2)

## 2020-12-11 LAB — MAGNESIUM: Magnesium: 2.1 mg/dL (ref 1.7–2.4)

## 2020-12-11 LAB — GLUCOSE, CAPILLARY
Glucose-Capillary: 81 mg/dL (ref 70–99)
Glucose-Capillary: 100 mg/dL — ABNORMAL HIGH (ref 70–99)
Glucose-Capillary: 106 mg/dL — ABNORMAL HIGH (ref 70–99)
Glucose-Capillary: 95 mg/dL (ref 70–99)
Glucose-Capillary: 108 mg/dL — ABNORMAL HIGH (ref 70–99)

## 2020-12-11 LAB — CULTURE, BLOOD (ROUTINE X 2)
Culture: NO GROWTH
Culture: NO GROWTH
Special Requests: ADEQUATE

## 2020-12-11 LAB — TRIGLYCERIDES: Triglycerides: 288 mg/dL — ABNORMAL HIGH (ref <150)

## 2020-12-11 MED ORDER — POTASSIUM CHLORIDE 20 MEQ PO PACK
40.0000 meq | PACK | Freq: Once | ORAL | Status: AC
  Administered 2020-12-11: 40 meq
  Filled 2020-12-11: qty 2

## 2020-12-11 MED ORDER — VITAL 1.5 CAL PO LIQD
1000.0000 mL | ORAL | Status: DC
  Administered 2020-12-11: 1000 mL
  Filled 2020-12-11: qty 1000

## 2020-12-11 MED ORDER — IBUPROFEN 100 MG/5ML PO SUSP
600.0000 mg | Freq: Four times a day (QID) | ORAL | Status: DC | PRN
  Administered 2020-12-11 – 2020-12-12 (×2): 600 mg
  Filled 2020-12-11 (×2): qty 30

## 2020-12-11 MED ORDER — PRENATAL MULTIVITAMIN CH
1.0000 | ORAL_TABLET | Freq: Every day | ORAL | Status: DC
  Filled 2020-12-11 (×2): qty 1

## 2020-12-11 MED ORDER — FUROSEMIDE 10 MG/ML IJ SOLN
40.0000 mg | Freq: Once | INTRAMUSCULAR | Status: AC
  Administered 2020-12-11: 40 mg via INTRAVENOUS
  Filled 2020-12-11: qty 4

## 2020-12-11 NOTE — Progress Notes (Signed)
Nutrition Follow-up  DOCUMENTATION CODES:   Not applicable  INTERVENTION:   Continue tube feeds via OG tube: - Change to Vital 1.5 @ 55 ml/hr (1320 ml/day) - ProSource TF 45 ml BID  Tube feeding regimen provides 2060 kcal, 111 grams of protein, and 1008 ml of H2O.   - Continue prenatal MVI per tube daily  NUTRITION DIAGNOSIS:   Increased nutrient needs related to acute illness, post-op healing as evidenced by estimated needs.  Ongoing, being addressed via TF  GOAL:   Patient will meet greater than or equal to 90% of their needs  Met via TF  MONITOR:   Vent status, Labs, Weight trends, TF tolerance, I & O's  REASON FOR ASSESSMENT:   Ventilator, Consult Enteral/tube feeding initiation and management  ASSESSMENT:   25 year old female who presented on 9/24 with SOB and chest pain. Pt is G1P1001 postpartum from a primary C-section 10 days prior to presentation. Pt admitted with pulmonary edema due to acute hypoxemic respiratory failure and severe pre-eclampsia. Pt developed severe ARDS and required intubation on 9/26.  9/24 - regular diet 9/26 - intubated  Discussed pt with RN and during ICU rounds. Pt may be ready for extubation trial soon. Noted pt with hypoglycemic episodes requiring D10 drip. RD to adjust tube feeds.  Plan at this time is to use breast pump only to help prevent engorgement.  Admit weight: 131.6 kg Current weight: 126.8 kg  Pt with non-pitting edema to BUE and +1 pitting edema to BLE.  Patient is currently intubated on ventilator support MV: 7.4 L/min Temp (24hrs), Avg:98.6 F (37 C), Min:98.2 F (36.8 C), Max:98.9 F (37.2 C)  Drips: Propofol: 7.7 ml/hr (provides 203 kcal daily from lipid) Fentanyl  Medications reviewed and include: colace, protonix, miralax, prenatal MVI  Labs reviewed: hemoglobin 8.6, TG 288 CBG's: 76-106 x 24 hours  UOP: 2075 ml x 24 hours I/O's: -3.5 L since admit  Diet Order:   Diet Order              Diet NPO time specified  Diet effective now                   EDUCATION NEEDS:   No education needs have been identified at this time  Skin:  Skin Assessment: Reviewed RN Assessment (C-section incision)  Last BM:  12/10/20 smear type 7  Height:   Ht Readings from Last 1 Encounters:  12/10/20 '5\' 4"'  (1.626 m)    Weight:   Wt Readings from Last 1 Encounters:  12/11/20 126.8 kg    Ideal Body Weight:  54.5 kg  BMI:  Body mass index is 47.98 kg/m.  Estimated Nutritional Needs:   Kcal:  2000-2200  Protein:  110-130 grams  Fluid:  >/= 2.0 L    Gustavus Bryant, MS, RD, LDN Inpatient Clinical Dietitian Please see AMiON for contact information.

## 2020-12-11 NOTE — Progress Notes (Addendum)
Subjective: Postpartum Day 15: Cesarean Delivery Patient reports sedated.    Objective: Vital signs in last 24 hours: Temp:  [98.2 F (36.8 C)-98.9 F (37.2 C)] 98.4 F (36.9 C) (09/30 1058) Pulse Rate:  [86-113] 100 (09/30 1300) Resp:  [17-25] 24 (09/30 1330) BP: (115-164)/(56-93) 115/66 (09/30 1330) SpO2:  [100 %] 100 % (09/30 1300) FiO2 (%):  [40 %] 40 % (09/30 1114) Weight:  [126.8 kg] 126.8 kg (09/30 0500)  Physical Exam:  General:  sedated Lochia: appropriate   Recent Labs    12/10/20 0448 12/11/20 0505  HGB 7.5* 8.6*  HCT 25.7* 28.4*    Assessment/Plan: Status post Cesarean section. ICU care for ARDS  Lactation cessation protocol per consult  Scheryl Darter 12/11/2020, 2:28 PM

## 2020-12-11 NOTE — Progress Notes (Signed)
   NAMEShady Burch, MRN:  [id number], DOB:  Jul 18, 1995, LOS: 6 ADMISSION DATE:  12/05/2020, CONSULTATION DATE:  9/25 REFERRING MD:  Dr. Ilda Basset, CHIEF COMPLAINT:  Resp distress   History of Present Illness:  25 yo female s/p C section 11/25/20  presented with respiratory distress, chills, cough, headache, and leg swelling.  Found to have hypoxia and chest xray showed b/l pulmonary infiltrates.  She required intubation and started on ARDS protocol.  Pertinent  Medical History  None  Significant Hospital Events: Including procedures, antibiotic start and stop dates in addition to other pertinent events   9/25 admitted to [facility] for resp distress, transfer to progressive. Diuresing and started on CAP coverage. 9/26 transfer to ICU. [place] as able while off BiPAP for work of breathing. 9/26 intubated for worsening tachypnia and respiratory failure 9/27 bronchscopy with BAL done  Interim History / Subjective:  FiO2 40%, PEEP 5.  Start on PS this AM.  Objective   Blood pressure 125/75, pulse 98, temperature 98.8 F (37.1 C), temperature source Oral, resp. rate 18, height _0  (1.626 m), weight 126.8 kg, SpO2 100 %.    Vent Mode: PSV;CPAP FiO2 (%):  [40 %] 40 % Set Rate:  [24 bmp] 24 bmp Vt Set:  [320 mL] 320 mL PEEP:  [5 cmH20-8 cmH20] 5 cmH20 Pressure Support:  [12 cmH20-15 cmH20] 12 cmH20 Plateau Pressure:  [10 cmH20-18 cmH20] 10 cmH20   Intake/Output Summary (Last 24 hours) at 12/11/2020 0741 Last data filed at 12/11/2020 0700 Gross per 24 hour  Intake 2553.56 ml  Output 2075 ml  Net 478.56 ml   Filed Weights   12/09/20 0518 12/10/20 0500 12/11/20 0500  Weight: 129.9 kg 127.6 kg 126.8 kg    Examination:  General - alert Eyes - pupils reactive ENT - ETT in place Cardiac - regular rate/rhythm, no murmur Chest - scattered rhonchi Abdomen - soft, non tender, + bowel sounds Extremities - no cyanosis, clubbing, or edema Skin - no rashes Neuro - RASS 0, follows  commands, moves all extremities   Resolved Hospital Problem list   Circulatory shock  Assessment & Plan:   Acute hypoxic/hypercapnic respiratory failure with ARDS in setting of CAP. - goal SpO2 > 92% - f/u CXR intermittently - negative fluid balance as able - might be ready for extubation trial soon - might need Bipap as bridge after extubation - completed ABx on 9/29  Anemia in setting of pregnancy. - f/u CBC - transfuse for Hb < 7 - check iron levels  Hypoglycemia. - resolved - d/c dextrose from IV fluid  S/p C section on 11/25/20. - per OB/Gyn  Best Practice (right click and "Reselect all SmartList Selections" daily)   Diet/type: tubefeeds and NPO w/ meds via tube DVT prophylaxis: lovenox GI prophylaxis: protonix Lines: Rt PICC  Foley:  N/A Code Status:  full code Last date of multidisciplinary goals of care discussion [9/28 discussed plan with father at bedside]  Critical care time: 38 minutes  Chesley Mires, MD [place] Pager - [phone] 12/11/2020, 7:51 AM

## 2020-12-11 NOTE — Progress Notes (Signed)
Pt placed back on full vent support due to increased RR and increased WOB. Pt tolerating well at this time, vitals stable, RN aware, RT will continue to monitor.

## 2020-12-12 ENCOUNTER — Inpatient Hospital Stay (HOSPITAL_COMMUNITY): Payer: 59

## 2020-12-12 DIAGNOSIS — J9601 Acute respiratory failure with hypoxia: Secondary | ICD-10-CM | POA: Diagnosis not present

## 2020-12-12 LAB — CBC
HCT: 27.6 % — ABNORMAL LOW (ref 36.0–46.0)
Hemoglobin: 8.6 g/dL — ABNORMAL LOW (ref 12.0–15.0)
MCH: 27.8 pg (ref 26.0–34.0)
MCHC: 31.2 g/dL (ref 30.0–36.0)
MCV: 89.3 fL (ref 80.0–100.0)
Platelets: 436 10*3/uL — ABNORMAL HIGH (ref 150–400)
RBC: 3.09 MIL/uL — ABNORMAL LOW (ref 3.87–5.11)
RDW: 13.8 % (ref 11.5–15.5)
WBC: 7.7 10*3/uL (ref 4.0–10.5)
nRBC: 0 % (ref 0.0–0.2)

## 2020-12-12 LAB — COMPREHENSIVE METABOLIC PANEL WITH GFR
ALT: 21 U/L (ref 0–44)
AST: 23 U/L (ref 15–41)
Albumin: 2.4 g/dL — ABNORMAL LOW (ref 3.5–5.0)
Alkaline Phosphatase: 100 U/L (ref 38–126)
Anion gap: 7 (ref 5–15)
BUN: 16 mg/dL (ref 6–20)
CO2: 29 mmol/L (ref 22–32)
Calcium: 8.8 mg/dL — ABNORMAL LOW (ref 8.9–10.3)
Chloride: 99 mmol/L (ref 98–111)
Creatinine, Ser: 0.44 mg/dL (ref 0.44–1.00)
GFR, Estimated: >60 mL/min
Glucose, Bld: 106 mg/dL — ABNORMAL HIGH (ref 70–99)
Potassium: 4 mmol/L (ref 3.5–5.1)
Sodium: 135 mmol/L (ref 135–145)
Total Bilirubin: 0.3 mg/dL (ref 0.3–1.2)
Total Protein: 6.5 g/dL (ref 6.5–8.1)

## 2020-12-12 LAB — IRON AND TIBC
Iron: 27 ug/dL — ABNORMAL LOW (ref 28–170)
Saturation Ratios: 8 % — ABNORMAL LOW (ref 10.4–31.8)
TIBC: 332 ug/dL (ref 250–450)
UIBC: 305 ug/dL

## 2020-12-12 LAB — RETICULOCYTES
Immature Retic Fract: 12.9 % (ref 2.3–15.9)
RBC.: 3.08 MIL/uL — ABNORMAL LOW (ref 3.87–5.11)
Retic Count, Absolute: 20.3 10*3/uL (ref 19.0–186.0)
Retic Ct Pct: 0.7 % (ref 0.4–3.1)

## 2020-12-12 LAB — GLUCOSE, CAPILLARY
Glucose-Capillary: 101 mg/dL — ABNORMAL HIGH (ref 70–99)
Glucose-Capillary: 113 mg/dL — ABNORMAL HIGH (ref 70–99)
Glucose-Capillary: 80 mg/dL (ref 70–99)
Glucose-Capillary: 87 mg/dL (ref 70–99)
Glucose-Capillary: 88 mg/dL (ref 70–99)
Glucose-Capillary: 90 mg/dL (ref 70–99)
Glucose-Capillary: 95 mg/dL (ref 70–99)

## 2020-12-12 LAB — MAGNESIUM: Magnesium: 1.9 mg/dL (ref 1.7–2.4)

## 2020-12-12 LAB — FERRITIN: Ferritin: 61 ng/mL (ref 11–307)

## 2020-12-12 MED ORDER — PRENATAL MULTIVITAMIN CH
1.0000 | ORAL_TABLET | Freq: Every day | ORAL | Status: DC
  Administered 2020-12-12: 1
  Filled 2020-12-12: qty 1

## 2020-12-12 MED ORDER — ACETAMINOPHEN 325 MG PO TABS: 650.0000 mg | ORAL_TABLET | ORAL | Status: DC | PRN

## 2020-12-12 MED ORDER — LORAZEPAM 2 MG/ML IJ SOLN: 1.0000 mg | INTRAMUSCULAR | Status: DC | PRN

## 2020-12-12 MED ORDER — FENTANYL CITRATE (PF) 100 MCG/2ML IJ SOLN: 50.0000 ug | INTRAMUSCULAR | Status: DC | PRN

## 2020-12-12 MED ORDER — BISACODYL 10 MG RE SUPP
10.0000 mg | Freq: Every day | RECTAL | Status: DC | PRN
  Filled 2020-12-12: qty 1

## 2020-12-12 MED ORDER — PRENATAL MULTIVITAMIN CH
1.0000 | ORAL_TABLET | Freq: Every day | ORAL | Status: DC
  Administered 2020-12-13: 1 via ORAL
  Filled 2020-12-12: qty 1

## 2020-12-12 MED ORDER — ACETAMINOPHEN 160 MG/5ML PO SOLN: 650.0000 mg | ORAL | Status: DC | PRN

## 2020-12-12 MED ORDER — ONDANSETRON HCL 4 MG/2ML IJ SOLN
4.0000 mg | Freq: Three times a day (TID) | INTRAMUSCULAR | Status: DC | PRN
  Administered 2020-12-12: 4 mg via INTRAVENOUS
  Filled 2020-12-12: qty 2

## 2020-12-12 MED ORDER — POLYETHYLENE GLYCOL 3350 17 G PO PACK: 17.0000 g | PACK | Freq: Every day | ORAL | Status: DC | PRN

## 2020-12-12 MED ORDER — ALPRAZOLAM 0.25 MG PO TABS: 0.2500 mg | ORAL_TABLET | Freq: Three times a day (TID) | ORAL | Status: DC | PRN

## 2020-12-12 MED ORDER — IBUPROFEN 100 MG/5ML PO SUSP: 600.0000 mg | Freq: Four times a day (QID) | ORAL | Status: DC | PRN

## 2020-12-12 MED ORDER — PANTOPRAZOLE SODIUM 40 MG PO TBEC
40.0000 mg | DELAYED_RELEASE_TABLET | Freq: Every day | ORAL | Status: DC
  Administered 2020-12-13: 40 mg via ORAL
  Filled 2020-12-12: qty 1

## 2020-12-12 MED ORDER — IBUPROFEN 200 MG PO TABS: 600.0000 mg | ORAL_TABLET | Freq: Four times a day (QID) | ORAL | Status: DC | PRN

## 2020-12-12 MED ORDER — CALCIUM CARBONATE ANTACID 500 MG PO CHEW: 2.0000 | CHEWABLE_TABLET | ORAL | Status: DC | PRN

## 2020-12-12 MED ORDER — DOCUSATE SODIUM 100 MG PO CAPS
100.0000 mg | ORAL_CAPSULE | Freq: Every day | ORAL | Status: DC | PRN
  Administered 2020-12-12: 100 mg via ORAL
  Filled 2020-12-12: qty 1

## 2020-12-12 NOTE — Progress Notes (Addendum)
eLink Physician-Brief Progress Note Patient Name: Melanie Burch DOB: May 07, 1995 MRN: [id number]   Date of Service  12/12/2020  HPI/Events of Note  Notified of patient complaining of nausea Has Zofran prn ordered antepartum  Seen extubated and not in acute distress  eICU Interventions  Ordered Zofran 4 mg IV prn     Intervention Category Minor Interventions: Routine modifications to care plan (e.g. PRN medications for pain, fever)  Rosalie Gums Kaiea Esselman 12/12/2020, 7:49 PM

## 2020-12-12 NOTE — Progress Notes (Signed)
RT note. Patient transported to CT and back without any complications. 

## 2020-12-12 NOTE — Evaluation (Signed)
Clinical/Bedside Swallow Evaluation Patient Details  Name: Melanie Burch MRN: [id number] Date of Birth: 09-Sep-1995  Today's Date: 12/12/2020 Time: SLP Start Time (ACUTE ONLY): 1205 SLP Stop Time (ACUTE ONLY): 1219 SLP Time Calculation (min) (ACUTE ONLY): 14 min  Past Medical History:  Past Medical History:  Diagnosis Date   GBS bacteriuria 12/05/2020   Past Surgical History:  Past Surgical History:  Procedure Laterality Date   CESAREAN SECTION     HPI:  Patient is a 25 year old female with PMH of C-section (10 days ago) presents to [facility] on 9/24 with respiratory distress.  Patient states on 9/24 she had sudden onset shortness of breath while she was sleeping.  She states that it felt like chest tightness and unable to take a deep breath.  Worse with exertion and lying flat.  Other symptoms were chills, cough, swelling of BLE, and a headache.  Patient denies chest pain, fever, wheezing, weight changes.  Patient denies any history of asthma or sleep apnea.  She was diagnosed with acute hypoxic respiratory failure and CAP.  She had a decline in status and was transferred to ICU requiring intubation.  She was intubated from 09/26-10/1.  CT of the head was showing no acute intracranial abnormality.  Chest xray was showing focal opacity in the left base that may represent NPA or aspiration and pulmonary venous congestion/mild edema can not be ruled out.    Assessment / Plan / Recommendation  Clinical Impression  Clinical swallowing evaluation was completed in setting of prolonged intubation using thin liquids via spoon, cup and straw, pureed material and dry solids.  The patient did not endorse any issues swallowing prior to admission.  Cranial nerve exam was completed and unremarkable.  Lingual, labial, facial and jaw range of motion and strength appeared to be adequate.  Facial sensation appeared to be intact and she did not endorse a difference in sensation from the right to left side of her  face.  Vocal qualtiy was breathy.  Her oral and pharyngeal swallow appeared to be functional.  Mastication of dry solids appeared adequate.  Swallow trigger was appreciated to palpation and overt s/s of aspiration were not seen despite serial sips of thin liquids via straw.  Suggest a regular diet with thin liquids.  ST follow up is not indicated.  If we can be of further assistance please feel free to reconsult. SLP Visit Diagnosis: Dysphagia, unspecified (R13.10)    Aspiration Risk  No limitations    Diet Recommendation   Regular with thin liquids  Medication Administration: Whole meds with liquid    Other  Recommendations Oral Care Recommendations: Oral care BID    Recommendations for follow up therapy are one component of a multi-disciplinary discharge planning process, led by the attending physician.  Recommendations may be updated based on patient status, additional functional criteria and insurance authorization.  Follow up Recommendations None        Swallow Study   General Date of Onset: 12/05/20 HPI: Patient is a 25 year old female with PMH of C-section (10 days ago) presents to [facility] on 9/24 with respiratory distress.  Patient states on 9/24 she had sudden onset shortness of breath while she was sleeping.  She states that it felt like chest tightness and unable to take a deep breath.  Worse with exertion and lying flat.  Other symptoms were chills, cough, swelling of BLE, and a headache.  Patient denies chest pain, fever, wheezing, weight changes.  Patient denies any history of asthma or sleep apnea.  She was diagnosed with acute hypoxic respiratory failure and CAP.  She had a decline in status and was transferred to ICU requiring intubation.  She was intubated from 09/26-10/1.  CT of the head was showing no acute intracranial abnormality.  Chest xray was showing focal opacity in the left base that may represent NPA or aspiration and pulmonary venous congestion/mild edema can not be  ruled out. Type of Study: Bedside Swallow Evaluation Previous Swallow Assessment: None noted at [facility]. Diet Prior to this Study: NPO Temperature Spikes Noted: No Respiratory Status: Nasal cannula History of Recent Intubation: Yes Length of Intubations (days): 5 days Date extubated: 12/12/20 Behavior/Cognition: Alert;Cooperative;Pleasant mood Oral Cavity Assessment: Within Functional Limits Oral Care Completed by SLP: No Oral Cavity - Dentition: Adequate natural dentition Vision: Functional for self-feeding Self-Feeding Abilities: Able to feed self Patient Positioning: Upright in bed Baseline Vocal Quality: Breathy Volitional Swallow: Able to elicit    Oral/Motor/Sensory Function Overall Oral Motor/Sensory Function: Within functional limits   Ice Chips Ice chips: Not tested   Thin Liquid Thin Liquid: Within functional limits Presentation: Cup;Self Fed;Spoon;Straw    Nectar Thick Nectar Thick Liquid: Not tested   Honey Thick Honey Thick Liquid: Not tested   Puree Puree: Within functional limits Presentation: Spoon;Self Fed   Solid     Solid: Within functional limits Presentation: Self Fed      Dimas Aguas, MA, CCC-SLP Acute Rehab SLP [phone]  Fleet Contras 12/12/2020,12:45 PM

## 2020-12-12 NOTE — Progress Notes (Signed)
eLink Physician-Brief Progress Note Patient Name: Taeja Debellis DOB: 1995/07/19 MRN: [id number]   Date of Service  12/12/2020  HPI/Events of Note  RN informing eLink of loose red tinged loose stool with red streaks noted, asking for hemoccult for next stool as well as AM labs . Hgb today was 8.6  eICU Interventions  Blood streaked stools already evident. No need to check hemoccult. H/H stable for the past few days. Will repeat CBC to include PT INR in am. On enoxaparin 60 mg Kinta. Already on pantoprazole. Discussed with bedside RN who is to relay labs to eLink especially if with significant abnormality.     Intervention Category Intermediate Interventions: Bleeding - evaluation and treatment with blood products  Darl Pikes 12/12/2020, 9:55 PM

## 2020-12-12 NOTE — Procedures (Signed)
Extubation Procedure Note  Patient Details:   Name: Melanie Burch DOB: 09-04-1995 MRN: [id number]   Airway Documentation:    Vent end date: 12/12/20 Vent end time: 0822   Evaluation  O2 sats: stable throughout Complications: No apparent complications Patient did tolerate procedure well. Bilateral Breath Sounds: Clear, Diminished   Yes  Pt extubated to 2L [place]. Pt tolerating well at this time. Cuff leak present, no stridor noted, RN at bedside,RT will continue to monitor.   Orie Fisherman Kourtnie Sachs 12/12/2020, 8:24 AM

## 2020-12-12 NOTE — Progress Notes (Signed)
PT Cancellation Note  Patient Details Name: Melanie Burch MRN: [id number] DOB: Feb 08, 1996   Cancelled Treatment:    Reason Eval/Treat Not Completed: Patient declined, no reason specified  Patient reported her stomach hurts too much from not having a bowel movement. Educated that mobility will help her be able to have a bowel movement. Pt continued to refuse and family(?) insisted she would not participate right now and we could try tomorrow. RN made aware.    Jerolyn Center, PT Pager [phone]   [name] 12/12/2020, 2:18 PM

## 2020-12-12 NOTE — Progress Notes (Signed)
PT Cancellation Note  Patient Details Name: Melanie Burch MRN: [id number] DOB: 08-Aug-1995   Cancelled Treatment:    Reason Eval/Treat Not Completed: Medical issues which prohibited therapy  Noted plans for extubation today. Will defer PT evaluation at this time and monitor for readiness.   Jerolyn Center, PT Pager [phone]   Scherrie November Matayah Reyburn 12/12/2020, 8:00 AM

## 2020-12-12 NOTE — Progress Notes (Signed)
eLink Physician-Brief Progress Note Patient Name: Melanie Burch DOB: 14-Jul-1995 MRN: [id number]   Date of Service  12/12/2020  HPI/Events of Note  Patient c/o severe headache on and off over the last several days. Describes as sharp R sided 10/10 pain. Nursing reports no neuro changes or focal neurological deficits.   eICU Interventions  Plan: Head CT Scan STAT.     Intervention Category Major Interventions: Other:  Fay Bagg Dennard Nip 12/12/2020, 5:13 AM

## 2020-12-12 NOTE — Progress Notes (Signed)
Subjective: Postpartum Day 15: Cesarean Delivery Patient sedated but responding.  Spoke with overnight nurse and she continues to wean from ventilator.   Objective: Vital signs in last 24 hours: Temp:  [98 F (36.7 C)-98.5 F (36.9 C)] 98.4 F (36.9 C) (10/01 0717) Pulse Rate:  [84-113] 107 (10/01 0821) Resp:  [14-25] 23 (10/01 0821) BP: (111-156)/(52-93) 156/69 (10/01 0800) SpO2:  [97 %-100 %] 98 % (10/01 0821) FiO2 (%):  [28 %-40 %] 28 % (10/01 0821) Weight:  [125.1 kg] 125.1 kg (10/01 0200)  Physical Exam:  General:  sedated Incision: C/D/I and very well healed - no defects Lochia: appropriate   Recent Labs    12/11/20 0505 12/12/20 0454  HGB 8.6* 8.6*  HCT 28.4* 27.6*     Assessment/Plan: Status post Cesarean section. ICU care for ARDS  Lactation cessation protocol per consult We will sign off but follow along peripherally. Please re-consult if any new issues or questions  Milas Hock 12/12/2020, 8:48 AM

## 2020-12-12 NOTE — Progress Notes (Signed)
   NAMEBronwen Burch, MRN:  [id number], DOB:  November 20, 1995, LOS: 7 ADMISSION DATE:  12/05/2020, CONSULTATION DATE:  9/25 REFERRING MD:  Dr. Ilda Basset, CHIEF COMPLAINT:  Resp distress   History of Present Illness:  25 yo female s/p C section 11/25/20  presented with respiratory distress, chills, cough, headache, and leg swelling.  Found to have hypoxia and chest xray showed b/l pulmonary infiltrates.  She required intubation and started on ARDS protocol.  Pertinent  Medical History  None  Significant Hospital Events: Including procedures, antibiotic start and stop dates in addition to other pertinent events   9/25 admitted to [facility] for resp distress, transfer to [facility]. Diuresing and started on CAP coverage. 9/26 transfer to ICU. Melanie Burch as able while off BiPAP for work of breathing. 9/26 intubated for worsening tachypnia and respiratory failure 9/27 bronchscopy with BAL done 10/1 headache >> CT head negative, extubated  Interim History / Subjective:  Does well with PS while on sedation.  Gets anxious with increased RR and HR when sedation cut off.  Had headache last night.  Objective   Blood pressure 133/83, pulse (!) 111, temperature 98.4 F (36.9 C), temperature source Oral, resp. rate (!) 21, height _0  (1.626 m), weight 125.1 kg, SpO2 100 %.    Vent Mode: PSV;CPAP FiO2 (%):  [40 %] 40 % Set Rate:  [24 bmp] 24 bmp Vt Set:  [320 mL] 320 mL PEEP:  [5 cmH20] 5 cmH20 Pressure Support:  [10 cmH20] 10 cmH20 Plateau Pressure:  [12 cmH20-18 cmH20] 18 cmH20   Intake/Output Summary (Last 24 hours) at 12/12/2020 0743 Last data filed at 12/12/2020 0500 Gross per 24 hour  Intake 1964.02 ml  Output 1700 ml  Net 264.02 ml   Filed Weights   12/10/20 0500 12/11/20 0500 12/12/20 0200  Weight: 127.6 kg 126.8 kg 125.1 kg    Examination:  General - alert Eyes - pupils reactive ENT - ETT in place Cardiac - regular rate/rhythm, no murmur Chest - equal breath sounds b/l, no wheezing  or rales Abdomen - soft, non tender, + bowel sounds Extremities - no cyanosis, clubbing, or edema Skin - no rashes Neuro - RASS 0, follows commands, moves extremities   Resolved Hospital Problem list   Circulatory shock  Assessment & Plan:   Acute hypoxic/hypercapnic respiratory failure with ARDS in setting of CAP. - extubation trial - can bridge with Bipap prn after extubation - goal SpO2 > 92% - f/u CXR intermittently - completed ABx on 9/29 - even to negative fluid balance as tolerated  Iron deficiency anemia. - add iron supplement and vit C once she can swallow pills - f/u CBC - transfuse for Hb < 7  Hypoglycemia. - resolved - monitor blood sugar on BMET  S/p C section on 11/25/20. - per OB/Gyn  Headache. - prn tylenol, motrin  Best Practice (right click and "Reselect all SmartList Selections" daily)   Diet/type: Speech to assess swallowing first after extubation DVT prophylaxis: lovenox GI prophylaxis: protonix Lines: Rt PICC  Foley:  N/A Code Status:  full code Last date of multidisciplinary goals of care discussion Updated pt's mother at bedside  Critical care time: 70 minutes  Chesley Mires, MD [place] Pager - [phone] 12/12/2020, 7:43 AM

## 2020-12-13 DIAGNOSIS — J9601 Acute respiratory failure with hypoxia: Secondary | ICD-10-CM | POA: Diagnosis not present

## 2020-12-13 LAB — CBC WITH DIFFERENTIAL/PLATELET
Abs Immature Granulocytes: 0.04 10*3/uL (ref 0.00–0.07)
Basophils Absolute: 0.1 10*3/uL (ref 0.0–0.1)
Basophils Relative: 1 %
Eosinophils Absolute: 0.2 10*3/uL (ref 0.0–0.5)
Eosinophils Relative: 3 %
HCT: 29.6 % — ABNORMAL LOW (ref 36.0–46.0)
Hemoglobin: 9.2 g/dL — ABNORMAL LOW (ref 12.0–15.0)
Immature Granulocytes: 0 %
Lymphocytes Relative: 26 %
Lymphs Abs: 2.4 10*3/uL (ref 0.7–4.0)
MCH: 27.5 pg (ref 26.0–34.0)
MCHC: 31.1 g/dL (ref 30.0–36.0)
MCV: 88.6 fL (ref 80.0–100.0)
Monocytes Absolute: 0.7 10*3/uL (ref 0.1–1.0)
Monocytes Relative: 7 %
Neutro Abs: 5.8 10*3/uL (ref 1.7–7.7)
Neutrophils Relative %: 63 %
Platelets: 472 10*3/uL — ABNORMAL HIGH (ref 150–400)
RBC: 3.34 MIL/uL — ABNORMAL LOW (ref 3.87–5.11)
RDW: 13.6 % (ref 11.5–15.5)
WBC: 9.2 10*3/uL (ref 4.0–10.5)
nRBC: 0 % (ref 0.0–0.2)

## 2020-12-13 LAB — PROTIME-INR
INR: 1 (ref 0.8–1.2)
Prothrombin Time: 13.3 seconds (ref 11.4–15.2)

## 2020-12-13 LAB — GLUCOSE, CAPILLARY
Glucose-Capillary: 94 mg/dL (ref 70–99)
Glucose-Capillary: 86 mg/dL (ref 70–99)
Glucose-Capillary: 127 mg/dL — ABNORMAL HIGH (ref 70–99)
Glucose-Capillary: 98 mg/dL (ref 70–99)
Glucose-Capillary: 72 mg/dL (ref 70–99)

## 2020-12-13 LAB — BASIC METABOLIC PANEL
Anion gap: 7 (ref 5–15)
BUN: 10 mg/dL (ref 6–20)
CO2: 28 mmol/L (ref 22–32)
Calcium: 8.9 mg/dL (ref 8.9–10.3)
Chloride: 101 mmol/L (ref 98–111)
Creatinine, Ser: 0.54 mg/dL (ref 0.44–1.00)
GFR, Estimated: >60 mL/min (ref >60)
Glucose, Bld: 92 mg/dL (ref 70–99)
Potassium: 3.8 mmol/L (ref 3.5–5.1)
Sodium: 136 mmol/L (ref 135–145)

## 2020-12-13 LAB — MAGNESIUM: Magnesium: 2 mg/dL (ref 1.7–2.4)

## 2020-12-13 MED ORDER — ASCORBIC ACID 500 MG PO TABS
250.0000 mg | ORAL_TABLET | Freq: Every day | ORAL | Status: DC
  Administered 2020-12-13 – 2020-12-14 (×2): 250 mg via ORAL
  Filled 2020-12-13 (×2): qty 1

## 2020-12-13 MED ORDER — TAB-A-VITE/IRON PO TABS
1.0000 | ORAL_TABLET | Freq: Every day | ORAL | Status: DC
  Administered 2020-12-13 – 2020-12-14 (×2): 1 via ORAL
  Filled 2020-12-13 (×2): qty 1

## 2020-12-13 NOTE — Progress Notes (Signed)
NAMESuriah Burch, MRN:  [id number], DOB:  Feb 25, 1996, LOS: 8 ADMISSION DATE:  12/05/2020, CONSULTATION DATE:   9/25 REFERRING MD:   Dr. Ilda Basset, CHIEF COMPLAINT:  Resp distress    History of Present Illness:  25 yo female s/p C section 11/25/20  presented with respiratory distress, chills, cough, headache, and leg swelling.  Found to have hypoxia and chest xray showed b/l pulmonary infiltrates.  She required intubation and started on ARDS protocol.  Pertinent  Medical History  Past Medical History: 12/05/2020: GBS bacteriuria   Significant Hospital Events: Including procedures, antibiotic start and stop dates in addition to other pertinent events   9/25 admitted to [facility] for resp distress, transfer to progressive. Diuresing and started on CAP coverage. 9/26 transfer to ICU. [place] as able while off BiPAP for work of breathing. 9/26 intubated for worsening tachypnia and respiratory failure 9/27 bronchscopy with BAL done 10/1 headache >> CT head negative, extubated  Interim History / Subjective:  Patient resting comfortably in bed and sleeping well. Tolerated extubation well.   Objective   Blood pressure 125/79, pulse 99, temperature 98.8 F (37.1 C), temperature source Oral, resp. rate 19, height _0  (1.626 m), weight 123.4 kg, SpO2 100 %.    Vent Mode: PSV;CPAP FiO2 (%):  [28 %-40 %] 28 % PEEP:  [5 cmH20] 5 cmH20 Pressure Support:  [10 cmH20] 10 cmH20   Intake/Output Summary (Last 24 hours) at 12/13/2020 0558 Last data filed at 12/12/2020 2256 Gross per 24 hour  Intake 203.62 ml  Output 1 ml  Net 202.62 ml   Filed Weights   12/11/20 0500 12/12/20 0200 12/13/20 0342  Weight: 126.8 kg 125.1 kg 123.4 kg    Examination: General: Asleep  HENT: PERRLA Lungs: course inspiratory bronchial sounds Cardiovascular: Intermittent tachycardia but regular rate Abdomen: soft, nontender and nondistended Extremities: good distal pulses, no swelling rash or cyanosis Neuro: RASS  of 0   Resolved Hospital Problem list   Circulatory Shock Acute hypoxic/hypercapnic respiratory failure with ARDS in setting of CAP.  Assessment & Plan:  Acute hypoxic/hypercapnic respiratory failure with ARDS in setting of CAP. - Extubated yesterday and tolerated it well.  - BIPAP as needed  - goal SpO2 > 92% - f/u CXR intermittently - finished course of ABx on 9/29 - even to negative fluid balance as tolerated - Consider transferring out of ICU today.    Iron deficiency anemia. - add iron supplement and vit C once she can swallow pills - f/u CBC - transfuse for Hb < 7   Hypoglycemia. - resolved - monitor blood sugar on BMET   S/p C section on 11/25/20. - per OB/Gyn   Headache. - prn tylenol, motrin  Best Practice (right click and "Reselect all SmartList Selections" daily)   Diet/type: full liquids  pending swallow eval DVT prophylaxis: LMWH GI prophylaxis: PPI Lines: Rt PICC Foley:  N/A Code Status:  full code Last date of multidisciplinary goals of care discussion - mother updated yesterday at bedside.   Labs   CBC: Recent Labs  Lab 12/08/20 0408 12/09/20 0118 12/09/20 0350 12/09/20 1056 12/09/20 1105 12/10/20 0448 12/11/20 0505 12/12/20 0454 12/13/20 0351  WBC 25.6*  --  11.4*  --   --  6.1 6.2 7.7 9.2  NEUTROABS 21.2*  --  7.3  --   --  3.0 3.2  --  5.8  HGB 8.9*   < > 8.4*   < > 8.5* 7.5* 8.6* 8.6* 9.2*  HCT 28.7*   < >  27.3*   < > 25.0* 25.7* 28.4* 27.6* 29.6*  MCV 87.8  --  89.8  --   --  89.9 89.6 89.3 88.6  PLT 431*  --  369  --   --  360 418* 436* 472*   < > = values in this interval not displayed.    Basic Metabolic Panel: Recent Labs  Lab 12/08/20 1710 12/09/20 0118 12/09/20 0350 12/09/20 1056 12/09/20 1105 12/09/20 1700 12/10/20 0448 12/10/20 1301 12/10/20 2141 12/11/20 0505 12/12/20 0454 12/13/20 0351  NA  --    < > 136   < > 139  --   --   --  134* 136 135 136  K  --    < > 4.5   < > 4.4  --   --   --  4.4 4.0 4.0 3.8  CL   --   --  105  --   --   --   --   --  97* 98 99 101  CO2  --   --  23  --   --   --   --   --  _0 GLUCOSE  --   --  88  --   --   --   --   --  160* 188* 106* 92  BUN  --   --  11  --   --   --   --   --  _1 CREATININE  --   --  0.69  --   --   --   --   --  0.49 0.52 0.44 0.54  CALCIUM  --   --  8.1*  --   --   --   --   --  8.6* 8.8* 8.8* 8.9  MG 2.0  --  1.9  --   --  1.9 2.0 1.7  --  2.1 1.9 2.0  PHOS 4.1  --  4.2  --   --  5.1* 3.8  --   --   --   --   --    < > = values in this interval not displayed.   GFR: Estimated Creatinine Clearance: 137.1 mL/min (by C-G formula based on SCr of 0.54 mg/dL). Recent Labs  Lab 12/07/20 0039 12/07/20 1035 12/10/20 0448 12/11/20 0505 12/12/20 0454 12/13/20 0351  PROCALCITON 0.10  --   --   --   --   --   WBC  --    < > 6.1 6.2 7.7 9.2   < > = values in this interval not displayed.    Liver Function Tests: Recent Labs  Lab 12/06/20 1013 12/07/20 0039 12/08/20 0408 12/09/20 0350 12/12/20 0454  AST 25 17 14* 15 23  ALT _2 ALKPHOS 129* 107 107 95 100  BILITOT 0.3 0.7 0.2* 0.4 0.3  PROT 7.2 6.6 6.4* 5.9* 6.5  ALBUMIN 2.8* 2.5* 2.2* 2.1* 2.4*   No results for input(s): LIPASE, AMYLASE in the last 168 hours. No results for input(s): AMMONIA in the last 168 hours.  ABG    Component Value Date/Time   PHART 7.305 (L) 12/09/2020 1105   PCO2ART 57.7 (H) 12/09/2020 1105   PO2ART 192 (H) 12/09/2020 1105   HCO3 28.5 (H) 12/09/2020 1105   TCO2 30 12/09/2020 1105   ACIDBASEDEF 2.0 12/08/2020 0042   O2SAT 100.0 12/09/2020 1105     Coagulation Profile: Recent Labs  Lab 12/13/20 0351  INR 1.0    Cardiac Enzymes: No results for input(s): CKTOTAL, CKMB, CKMBINDEX, TROPONINI in the last 168 hours.  HbA1C: No results found for: HGBA1C  CBG: Recent Labs  Lab 12/12/20 1110 12/12/20 1541 12/12/20 1917 12/12/20 2341 12/13/20 0313  GLUCAP 95 90 87 80 94    Review of Systems:   Negative except  what is noted in the interm history.  Past Medical History:  She,  has a past medical history of GBS bacteriuria (12/05/2020).   Surgical History:   Past Surgical History:  Procedure Laterality Date   CESAREAN SECTION       Social History:   reports that she has never smoked. She has never used smokeless tobacco. She reports current alcohol use. She reports that she does not use drugs.   Family History:  Her family history is not on file.   Allergies No Known Allergies   Home Medications  Prior to Admission medications   Medication Sig Start Date End Date Taking? Authorizing Provider  ibuprofen (ADVIL) 800 MG tablet Take 800 mg by mouth every 8 (eight) hours.   Yes [provider]  valACYclovir (VALTREX) 1000 MG tablet Take 1 tablet (1,000 mg total) by mouth 2 (two) times daily. 09/15/19   Horton, Barbette Hair, MD     Critical care time: [place], D.O.  Internal Medicine Resident, PGY-3 [facility]  Pager: [phone] 6:08 AM, 12/13/2020

## 2020-12-13 NOTE — Evaluation (Signed)
Physical Therapy Evaluation Patient Details Name: Melanie Burch MRN: [id number] DOB: 1995-09-11 Today's Date: 12/13/2020  History of Present Illness  25 yo female s/p C section 11/25/20  presented with respiratory distress, chills, cough, headache, and leg swelling.  Found to have hypoxia and chest xray showed b/l pulmonary infiltrates.  She required intubation and started on ARDS protocol. Extubated 10/1  Clinical Impression   Pt admitted secondary to problem above with deficits below. PTA patient was independent with mobility and ADLs. She has a newborn son at home to care for.  Pt currently requires minguard assist and use of RW due to her legs feeling shakey.  Anticipate patient will benefit from PT to address problems listed below and should progress to not needing DME for home. Will continue to follow acutely to maximize functional mobility independence and safety.          Recommendations for follow up therapy are one component of a multi-disciplinary discharge planning process, led by the attending physician.  Recommendations may be updated based on patient status, additional functional criteria and insurance authorization.  Follow Up Recommendations No PT follow up    Equipment Recommendations  None recommended by PT    Recommendations for Other Services       Precautions / Restrictions Precautions Precautions: None      Mobility  Bed Mobility               General bed mobility comments: pt OOB on arrival; educated in rolling to side and then sit and reversing for return to bed    Transfers Overall transfer level: Modified independent Equipment used: Rolling walker (2 wheeled)             General transfer comment: pt requested to use RW as she did with RN earlier; first day walking and reported her legs feel shaky  Ambulation/Gait Ambulation/Gait assistance: Min guard Gait Distance (Feet): 150 Feet Assistive device: Rolling walker (2 wheeled) Gait  Pattern/deviations: Step-through pattern;Decreased stride length Gait velocity: slow   General Gait Details: HR up to 126 bpm when leads came off; once seated and reattached HR 106 bpm  Stairs            Wheelchair Mobility    Modified Rankin (Stroke Patients Only)       Balance Overall balance assessment: No apparent balance deficits (not formally assessed)                                           Pertinent Vitals/Pain Pain Assessment: Faces Faces Pain Scale: Hurts a little bit Pain Location: abdomen Pain Descriptors / Indicators: Discomfort;Sore Pain Intervention(s): Limited activity within patient's tolerance    Home Living Family/patient expects to be discharged to:: Private residence Living Arrangements: Parent;Other relatives Available Help at Discharge: Family;Available 24 hours/day   Home Access: Level entry     Home Layout: One level Home Equipment: None      Prior Function Level of Independence: Independent               Hand Dominance        Extremity/Trunk Assessment   Upper Extremity Assessment Upper Extremity Assessment: Overall WFL for tasks assessed    Lower Extremity Assessment Lower Extremity Assessment: Overall WFL for tasks assessed    Cervical / Trunk Assessment Cervical / Trunk Assessment: Other exceptions Cervical / Trunk Exceptions: s/p c-section; healing abd  incision  Communication   Communication: No difficulties  Cognition Arousal/Alertness: Awake/alert Behavior During Therapy: WFL for tasks assessed/performed Overall Cognitive Status: Within Functional Limits for tasks assessed                                        General Comments General comments (skin integrity, edema, etc.): Discussed role of PT and not needing PT when she discharges. Discussed progressing to walking without RW prior to discharge as she will want hands-free to be able to hold her infant son    Exercises      Assessment/Plan    PT Assessment Patient needs continued PT services  PT Problem List Decreased activity tolerance;Decreased mobility;Decreased knowledge of use of DME;Obesity;Pain       PT Treatment Interventions DME instruction;Gait training;Functional mobility training;Therapeutic activities;Patient/family education    PT Goals (Current goals can be found in the Care Plan section)  Acute Rehab PT Goals Patient Stated Goal: go home tomorrow PT Goal Formulation: With patient Potential to Achieve Goals: Good    Frequency Min 3X/week   Barriers to discharge        Co-evaluation               AM-PAC PT "6 Clicks" Mobility  Outcome Measure Help needed turning from your back to your side while in a flat bed without using bedrails?: None Help needed moving from lying on your back to sitting on the side of a flat bed without using bedrails?: A Little Help needed moving to and from a bed to a chair (including a wheelchair)?: None Help needed standing up from a chair using your arms (e.g., wheelchair or bedside chair)?: None Help needed to walk in hospital room?: A Little Help needed climbing 3-5 steps with a railing? : A Little 6 Click Score: 21    End of Session   Activity Tolerance: Patient tolerated treatment well Patient left: in chair;with call bell/phone within reach (messaged RN that chair alarm was not set by accident) Nurse Communication: Mobility status;Other (comment) (elevated HR with activity; need to set chair alarm (PT off unit when realized)) PT Visit Diagnosis: Difficulty in walking, not elsewhere classified (R26.2)    Time: 1036-1050 PT Time Calculation (min) (ACUTE ONLY): 14 min   Charges:   PT Evaluation $PT Eval Low Complexity: 1 Low           Jerolyn Center, PT Pager [phone]   Zena Amos 12/13/2020, 11:09 AM

## 2020-12-14 DIAGNOSIS — J9601 Acute respiratory failure with hypoxia: Secondary | ICD-10-CM | POA: Diagnosis not present

## 2020-12-14 MED ORDER — FERROUS GLUCONATE 324 (38 FE) MG PO TABS: 324.0000 mg | ORAL_TABLET | ORAL | 2 refills | Status: DC

## 2020-12-14 NOTE — Progress Notes (Signed)
   Patient Saturations on Room Air while Ambulating = 94%. pt ambulated down the hall maintain oxygen saturation 94% room air, no complaints of SOB or discomfort.

## 2020-12-14 NOTE — Progress Notes (Signed)
NAMEElan Burch, MRN:  [id number], DOB:  03/02/1996, LOS: 9 ADMISSION DATE:  12/05/2020, CONSULTATION DATE:   9/25 REFERRING MD:   Dr. Ilda Basset, CHIEF COMPLAINT:  Resp distress    History of Present Illness:  25 yo female s/p C section 11/25/20  presented with respiratory distress, chills, cough, headache, and leg swelling.  Found to have hypoxia and chest xray showed b/l pulmonary infiltrates.  She required intubation and started on ARDS protocol and extubated without complication.   Pertinent  Medical History  Past Medical History: 12/05/2020: GBS bacteriuria   Significant Hospital Events: Including procedures, antibiotic start and stop dates in addition to other pertinent events   9/25 admitted to [facility] for resp distress, transfer to progressive. Diuresing and started on CAP coverage. 9/26 transfer to ICU. [place] as able while off BiPAP for work of breathing. 9/26 intubated for worsening tachypnia and respiratory failure 9/27 bronchscopy with BAL done 10/1 headache >> CT head negative, extubated  Interim History / Subjective:  Patient resting in bed.  She denies any new complaints today.  She states that she tolerated physical therapy well and feels that her breathing has significantly improved.  Objective   Blood pressure 130/64, pulse (!) 113, temperature 98.9 F (37.2 C), temperature source Oral, resp. rate 16, height _0  (1.626 m), weight 123.4 kg, SpO2 96 %.        Intake/Output Summary (Last 24 hours) at 12/14/2020 0606 Last data filed at 12/13/2020 1800 Gross per 24 hour  Intake --  Output 400 ml  Net -400 ml   Filed Weights   12/11/20 0500 12/12/20 0200 12/13/20 0342  Weight: 126.8 kg 125.1 kg 123.4 kg    Examination: General: Alert and oriented HENT: PERRLA Lungs:  lung sounds clear to auscultation laterally Cardiovascular:  regular rate and rhythm Abdomen: soft, nontender and nondistended Extremities: good distal pulses, no swelling rash or  cyanosis Neuro: RASS of 0   Resolved Hospital Problem list   Circulatory Shock Acute hypoxic/hypercapnic respiratory failure with ARDS in setting of CAP.  Assessment & Plan:  Acute hypoxic/hypercapnic respiratory failure with ARDS in setting of CAP. Acute respiratory failure has resolved. She tolerated walking with minimal O2 desaturation. She continues to have improvement of her O2 requirement and likely will be able to to d/c today without supplemental O2.  - supplemental O2 as needed  - goal SpO2 > 92% -Discharge today   Iron deficiency anemia. - Started multivitamin with iron yesterday -We will discharge home today  Headache. - prn tylenol, motrin  Best Practice (right click and "Reselect all SmartList Selections" daily)   Diet/type: regular diet DVT prophylaxis: LMWH GI prophylaxis: n/a Lines: n/a Foley:  N/A Code Status:  full code Last date of multidisciplinary goals of care discussion - spoke to patient about plan.   Labs   CBC: Recent Labs  Lab 12/08/20 0408 12/09/20 0118 12/09/20 0350 12/09/20 1056 12/09/20 1105 12/10/20 0448 12/11/20 0505 12/12/20 0454 12/13/20 0351  WBC 25.6*  --  11.4*  --   --  6.1 6.2 7.7 9.2  NEUTROABS 21.2*  --  7.3  --   --  3.0 3.2  --  5.8  HGB 8.9*   < > 8.4*   < > 8.5* 7.5* 8.6* 8.6* 9.2*  HCT 28.7*   < > 27.3*   < > 25.0* 25.7* 28.4* 27.6* 29.6*  MCV 87.8  --  89.8  --   --  89.9 89.6 89.3 88.6  PLT 431*  --  369  --   --  360 418* 436* 472*   < > = values in this interval not displayed.    Basic Metabolic Panel: Recent Labs  Lab 12/08/20 1710 12/09/20 0118 12/09/20 0350 12/09/20 1056 12/09/20 1105 12/09/20 1700 12/10/20 0448 12/10/20 1301 12/10/20 2141 12/11/20 0505 12/12/20 0454 12/13/20 0351  NA  --    < > 136   < > 139  --   --   --  134* 136 135 136  K  --    < > 4.5   < > 4.4  --   --   --  4.4 4.0 4.0 3.8  CL  --   --  105  --   --   --   --   --  97* 98 99 101  CO2  --   --  23  --   --   --   --    --  _0 GLUCOSE  --   --  88  --   --   --   --   --  160* 188* 106* 92  BUN  --   --  11  --   --   --   --   --  _1 CREATININE  --   --  0.69  --   --   --   --   --  0.49 0.52 0.44 0.54  CALCIUM  --   --  8.1*  --   --   --   --   --  8.6* 8.8* 8.8* 8.9  MG 2.0  --  1.9  --   --  1.9 2.0 1.7  --  2.1 1.9 2.0  PHOS 4.1  --  4.2  --   --  5.1* 3.8  --   --   --   --   --    < > = values in this interval not displayed.   GFR: Estimated Creatinine Clearance: 137.1 mL/min (by C-G formula based on SCr of 0.54 mg/dL). Recent Labs  Lab 12/10/20 0448 12/11/20 0505 12/12/20 0454 12/13/20 0351  WBC 6.1 6.2 7.7 9.2    Liver Function Tests: Recent Labs  Lab 12/08/20 0408 12/09/20 0350 12/12/20 0454  AST 14* 15 23  ALT _2 ALKPHOS 107 95 100  BILITOT 0.2* 0.4 0.3  PROT 6.4* 5.9* 6.5  ALBUMIN 2.2* 2.1* 2.4*   No results for input(s): LIPASE, AMYLASE in the last 168 hours. No results for input(s): AMMONIA in the last 168 hours.  ABG    Component Value Date/Time   PHART 7.305 (L) 12/09/2020 1105   PCO2ART 57.7 (H) 12/09/2020 1105   PO2ART 192 (H) 12/09/2020 1105   HCO3 28.5 (H) 12/09/2020 1105   TCO2 30 12/09/2020 1105   ACIDBASEDEF 2.0 12/08/2020 0042   O2SAT 100.0 12/09/2020 1105     Coagulation Profile: Recent Labs  Lab 12/13/20 0351  INR 1.0    Cardiac Enzymes: No results for input(s): CKTOTAL, CKMB, CKMBINDEX, TROPONINI in the last 168 hours.  HbA1C: No results found for: HGBA1C  CBG: Recent Labs  Lab 12/13/20 0313 12/13/20 0706 12/13/20 1108 12/13/20 1522 12/13/20 1920  GLUCAP 94 86 127* 98 72    Review of Systems:   Negative except what is noted in the interm history.  Past Medical History:  She,  has a past medical history of GBS bacteriuria (12/05/2020).  Surgical History:   Past Surgical History:  Procedure Laterality Date   CESAREAN SECTION       Social History:   reports that she has never smoked. She has  never used smokeless tobacco. She reports current alcohol use. She reports that she does not use drugs.   Family History:  Her family history is not on file.   Allergies No Known Allergies   Home Medications  Prior to Admission medications   Medication Sig Start Date End Date Taking? Authorizing Provider  ibuprofen (ADVIL) 800 MG tablet Take 800 mg by mouth every 8 (eight) hours.   Yes [provider]  valACYclovir (VALTREX) 1000 MG tablet Take 1 tablet (1,000 mg total) by mouth 2 (two) times daily. 09/15/19   Horton, Barbette Hair, MD     Critical care time: [place], D.O.  Internal Medicine Resident, PGY-3 [facility]  Pager: [phone] 6:06 AM, 12/14/2020

## 2020-12-14 NOTE — Discharge Summary (Signed)
Physician Discharge Summary  Patient ID: Melanie Burch MRN: [id number] DOB/AGE: [birth year] 27 y.o.  Admit date: 12/05/2020 Discharge date: 12/14/2020  Problem List Principal Problem:   Acute respiratory failure with hypoxia (HCC) Active Problems:   Hx of cesarean section   Severe pre-eclampsia   Wound dehiscence, cesarean   BMI 40.0-44.9, adult (HCC)   Acute pulmonary edema (HCC)   Hypokalemia   Postpartum hypertension   Hyponatremia   Elevated brain natriuretic peptide (BNP) level  HPI: 25 yo female s/p C section 11/25/20  presented with respiratory distress, chills, cough, headache, and leg swelling.  Found to have hypoxia and chest xray showed b/l pulmonary infiltrates.  She required intubation and started on ARDS protocol and extubated without complication.   Hospital Course:  Patient presented 27 weeks postop for C-section on 11/25/2020 with respiratory distress, chills, cough, headache, and leg swelling concerning for severe preeclampsia with pulmonary edema.  CT a was negative for PE and echo negative for postpartum heart failure.  Patients respiratory status worsened requiring intubation for for ARDS.  Patient completed a 5-day course of azithromycin and ceftriaxone with improvement of her respiratory function.  Patient was extubated without complications.  Patient was able to ambulate on room air with SPO2 of 94 prior to being discharged.   Labs at discharge Lab Results  Component Value Date   CREATININE 0.54 12/13/2020   BUN 10 12/13/2020   NA 136 12/13/2020   K 3.8 12/13/2020   CL 101 12/13/2020   CO2 28 12/13/2020   Lab Results  Component Value Date   WBC 9.2 12/13/2020   HGB 9.2 (L) 12/13/2020   HCT 29.6 (L) 12/13/2020   MCV 88.6 12/13/2020   PLT 472 (H) 12/13/2020   Lab Results  Component Value Date   ALT 21 12/12/2020   AST 23 12/12/2020   ALKPHOS 100 12/12/2020   BILITOT 0.3 12/12/2020   Lab Results  Component Value Date   INR 1.0 12/13/2020     Current radiology studies No results found.  Disposition:  Discharge disposition: 01-Home or Self Care      Discharge Instructions     Diet - low sodium heart healthy   Complete by: As directed    Diet general   Complete by: As directed    Discharge instructions   Complete by: As directed    Please make sure to take the oral iron every other day and follow up with you OBGYN and Pulmonologist.   Increase activity slowly   Complete by: As directed    No wound care   Complete by: As directed       Allergies as of 12/14/2020   No Known Allergies      Medication List     TAKE these medications    ferrous gluconate 324 MG tablet Commonly known as: FERGON Take 1 tablet (324 mg total) by mouth every other day.   ibuprofen 800 MG tablet Commonly known as: ADVIL Take 800 mg by mouth every 8 (eight) hours.   valACYclovir 1000 MG tablet Commonly known as: VALTREX Take 1 tablet (1,000 mg total) by mouth 2 (two) times daily.        Follow-up Information     [place] Bing, MD Follow up in 1 week(s).   Specialty: Obstetrics and Gynecology Contact information: [address] [place] [address] [phone]         [facility] Follow up on 12/15/2020.   Specialty: Pulmonology Why: at 2:30PM Contact  information: [address] [place] [address] [phone]                 Discharged Condition: good  Time spent on discharge greater than 40 minutes.  Vital signs at Discharge. Temp:  [98.4 F (36.9 C)-98.9 F (37.2 C)] 98.4 F (36.9 C) (10/03 1105) Pulse Rate:  [79-113] 100 (10/03 0845) Resp:  [12-26] 12 (10/03 0845) BP: (112-130)/(54-86) 125/77 (10/03 0845) SpO2:  [93 %-97 %] 94 % (10/03 0845) Office follow up Special Information or instructions. Signed:  Chari Manning, D.O.  Internal Medicine Resident, PGY-3 [facility]  Pager:  [phone] 1:30 PM, 12/14/2020   **Please contact the on call pager after 5 pm and on weekends at [phone].**

## 2020-12-14 NOTE — Progress Notes (Signed)
Melanie Burch to be D/C'd Home per MD order.  Discussed with the patient and all questions fully answered.  VSS, Skin clean, dry and intact without evidence of skin break down, no evidence of skin tears noted. IV catheter discontinued intact. Site without signs and symptoms of complications. Dressing and pressure applied.  An After Visit Summary was printed and given to the patient. Patient received prescription.  D/c education completed with patient/family including follow up instructions, medication list, d/c activities limitations if indicated, with other d/c instructions as indicated by MD - patient able to verbalize understanding, all questions fully answered.   Patient instructed to return to ED, call 911, or call MD for any changes in condition.   Patient escorted via WC, and D/C home via private auto.   Conley Canal Montejano 12/14/2020 11:59 AM

## 2020-12-14 NOTE — Progress Notes (Signed)
Physical Therapy Treatment Patient Details Name: Melanie Burch MRN: [id number] DOB: Sep 25, 1995 Today's Date: 12/14/2020   History of Present Illness 25 yo female admitted 9/24 with dyspnea and chest pain with hypoxia and pulmonary infiltrates. Pt s/p C-section 11/26/20 in [place] in town visiting family.  She required intubation 9/26 and started on ARDS protocol. Extubated 10/1. PMhx: obesity    PT Comments    Pt pleasant and reports feeling weak but willing to progress gait without RW this session. Pt with increased gait distance with need for standing rest due to desaturation to 90% during gait. Pt educated for progressive walking program, HEP and benefit of home pulse ox. Pt plans to stay with grandmother in [place] at D/C.     Recommendations for follow up therapy are one component of a multi-disciplinary discharge planning process, led by the attending physician.  Recommendations may be updated based on patient status, additional functional criteria and insurance authorization.  Follow Up Recommendations  No PT follow up     Equipment Recommendations  None recommended by PT    Recommendations for Other Services       Precautions / Restrictions Precautions Precautions: Other (comment) Precaution Comments: watch sats     Mobility  Bed Mobility Overal bed mobility: Modified Independent             General bed mobility comments: HOb 30 degrees with pt able to transition without assist    Transfers Overall transfer level: Modified independent               General transfer comment: pt able to rise from bed and toilet without assist  Ambulation/Gait Ambulation/Gait assistance: Supervision Gait Distance (Feet): 250 Feet Assistive device: None Gait Pattern/deviations: Step-through pattern;Decreased stride length   Gait velocity interpretation: 1.31 - 2.62 ft/sec, indicative of limited community ambulator General Gait Details: pt with increased sway with  gait which pt states is not her baseline. Slow steady gait with SpO2 dropping to 90% with gait. Standing rest break after 150' to recover to 95% then completed additional 100'. Pt with HR 97-116 with activity   Stairs             Wheelchair Mobility    Modified Rankin (Stroke Patients Only)       Balance Overall balance assessment: No apparent balance deficits (not formally assessed)                                          Cognition Arousal/Alertness: Awake/alert Behavior During Therapy: WFL for tasks assessed/performed Overall Cognitive Status: Within Functional Limits for tasks assessed                                        Exercises General Exercises - Lower Extremity Long Arc Quad: AROM;Both;Seated;20 reps Hip Flexion/Marching: AROM;Both;Seated;20 reps    General Comments        Pertinent Vitals/Pain Pain Assessment: No/denies pain    Home Living                      Prior Function            PT Goals (current goals can now be found in the care plan section) Progress towards PT goals: Progressing toward goals    Frequency    Min  3X/week      PT Plan Current plan remains appropriate    Co-evaluation              AM-PAC PT "6 Clicks" Mobility   Outcome Measure  Help needed turning from your back to your side while in a flat bed without using bedrails?: None Help needed moving from lying on your back to sitting on the side of a flat bed without using bedrails?: None Help needed moving to and from a bed to a chair (including a wheelchair)?: None Help needed standing up from a chair using your arms (e.g., wheelchair or bedside chair)?: None Help needed to walk in hospital room?: A Little Help needed climbing 3-5 steps with a railing? : A Little 6 Click Score: 22    End of Session   Activity Tolerance: Patient tolerated treatment well Patient left: in chair;with call bell/phone within  reach Nurse Communication: Mobility status PT Visit Diagnosis: Difficulty in walking, not elsewhere classified (R26.2)     Time: 1191-4782 PT Time Calculation (min) (ACUTE ONLY): 22 min  Charges:  $Gait Training: 8-22 mins                     [place] Sylvan P, PT Acute Rehabilitation Services Pager: [phone] Office: [phone]    Davanna He B Oluwadamilola Deliz 12/14/2020, 10:18 AM

## 2020-12-14 NOTE — TOC Transition Note (Signed)
Transition of Care [facility]) - CM/SW Discharge Note   Patient Details  Name: Melanie Burch MRN: [id number] Date of Birth: 12/28/1995  [facility] CM/SW Contact:  Tom-Johnson, Hershal Coria, RN Phone Number: 12/14/2020, 12:53 PM   Clinical Narrative:    Patient is scheduled for discharge today. CM spoke with patient sitting up in chair in room. Excited to be off vent and going home to her child. No needs recommended by PT and patient denies needs. Family to transport at discharge. No further TOC needed.   Final next level of care: Home/Self Care Barriers to Discharge: No Barriers Identified   Patient Goals and CMS Choice Patient states their goals for this hospitalization and ongoing recovery are:: To go home CMS Medicare.gov Compare Post Acute Care list provided to:: Patient Choice offered to / list presented to : NA  Discharge Placement                       Discharge Plan and Services   Discharge Planning Services: CM Consult            DME Arranged: N/A DME Agency: NA       HH Arranged: NA HH Agency: NA        Social Determinants of Health (SDOH) Interventions     Readmission Risk Interventions No flowsheet data found.

## 2020-12-15 ENCOUNTER — Encounter: Payer: Self-pay | Admitting: Adult Health

## 2020-12-15 ENCOUNTER — Other Ambulatory Visit: Payer: Self-pay

## 2020-12-15 ENCOUNTER — Ambulatory Visit (INDEPENDENT_AMBULATORY_CARE_PROVIDER_SITE_OTHER): Payer: 59

## 2020-12-15 ENCOUNTER — Ambulatory Visit (INDEPENDENT_AMBULATORY_CARE_PROVIDER_SITE_OTHER): Payer: 59 | Admitting: Adult Health

## 2020-12-15 VITALS — BP 110/66 | HR 92 | Temp 98.2°F | Ht 64.0 in | Wt 266.0 lb

## 2020-12-15 DIAGNOSIS — J9601 Acute respiratory failure with hypoxia: Secondary | ICD-10-CM

## 2020-12-15 DIAGNOSIS — J810 Acute pulmonary edema: Secondary | ICD-10-CM | POA: Diagnosis not present

## 2020-12-15 NOTE — Assessment & Plan Note (Signed)
Resolved  o2 sats normal on room air   Plan  Patient Instructions  Chest x-ray today Activity as tolerated.  Follow up with Dr. Celine Mans in 4 weeks and As needed

## 2020-12-15 NOTE — Patient Instructions (Addendum)
Chest x-ray today Activity as tolerated.  Follow up with Dr. Celine Mans in 4 weeks and As needed

## 2020-12-15 NOTE — Progress Notes (Signed)
  ID: Melanie Burch, female    DOB: 1995-07-16, 25 y.o.   MRN: [id number]  Chief Complaint  Patient presents with   Follow-up    Referring provider: No ref. provider found  HPI: 25 year old female never smoker seen for pulmonary consult December 06, 2020 for acute hypoxic respiratory failure (10 days postpartum) found to have ARDS in the setting of possible community-acquired pneumonia and pulmonary edema and pre Eclampsia   TEST/EVENTS :  C-section November 25, 2020  CT chest December 05, 2020 negative for PE, extensive dense airspace consolidations and surrounding groundglass opacity throughout both lungs in the predominantly perihilar and bibasilar distribution  CT head December 12, 2020 negative  2D echo December 05, 2020 showed normal EF at 6065%.  RV systolic function normal.  Right ventricular size is normal fairly mildly elevated pulmonary artery systolic pressure.  12/15/2020 Follow up ; acute hypoxic respiratory failure with ARDS secondary to pulmonary edema , post partum preeclampsia , +/- community-acquired pneumonia , post hospital follow-up Patient returns for a post hospital follow-up.  Patient was seen for pulmonary consult during hospitalization December 06, 2020 for acute hypoxic and hypercarbic respiratory failure.  Found to have ARDS in the setting of acute pulmonary edema from suspected post partum pre eclampsia and possible CAP .  Patient was 10 days postpartum/C-section on November 25, 2020. Chart review shows UA + protein 11/14/20.  Post delivery she says she had a lot of swelling in legs and feet R>L . That continued to worsen after discharge . Venous dopplers were done and negative . She says on September 24 she had sudden worsening of severe shortness of breath and chest tightness.  On presentation to the emergency room she had significant hypoxia.  She was placed on high flow oxygen.  Patient was seen by OB/GYN.  Trans-vaginal ultrasound did not show  definite POC.  Patient was started on empiric antibiotics, diuresis CT chest showed extensive dense airspace consolidations and groundglass opacities.  COVID-19 testing was negative.  Patient ultimately required intubation and mechanical vent support.  She was extubated on December 12, 2020. Since discharge patient is feeling better .has decreased dyspnea. Remains weak and low energy .  Today in the office O2 saturations are 98% on room air. Checks O2 sats at home O2 sats >96% on room air .  Weight is down 24lbs . Legs swelling is much better.   No Known Allergies  Immunization History  Administered Date(s) Administered   Moderna Sars-Covid-2 Vaccination 07/15/2019, 08/12/2019    Past Medical History:  Diagnosis Date   GBS bacteriuria 12/05/2020    Tobacco History: Social History   Tobacco Use  Smoking Status Never  Smokeless Tobacco Never   Counseling given: Not Answered   Outpatient Medications Prior to Visit  Medication Sig Dispense Refill   ferrous gluconate (FERGON) 324 MG tablet Take 1 tablet (324 mg total) by mouth every other day. 15 tablet 2   ibuprofen (ADVIL) 800 MG tablet Take 800 mg by mouth every 8 (eight) hours.     valACYclovir (VALTREX) 1000 MG tablet Take 1 tablet (1,000 mg total) by mouth 2 (two) times daily. 10 tablet 0   No facility-administered medications prior to visit.     Review of Systems:   Constitutional:   No  weight loss, night sweats,  Fevers, chills, f +atigue, or  lassitude.  HEENT:   No headaches,  Difficulty swallowing,  Tooth/dental problems, or  Sore throat,  No sneezing, itching, ear ache, nasal congestion, post nasal drip,   CV:  No chest pain,  Orthopnea, PND, swelling in lower extremities, anasarca, dizziness, palpitations, syncope.   GI  No heartburn, indigestion, abdominal pain, nausea, vomiting, diarrhea, change in bowel habits, loss of appetite, bloody stools.   Resp:  No excess mucus, no productive cough,  No  non-productive cough,  No coughing up of blood.  No change in color of mucus.  No wheezing.  No chest wall deformity  Skin: no rash or lesions.  GU: no dysuria, change in color of urine, no urgency or frequency.  No flank pain, no hematuria   MS:  No joint pain or swelling.  No decreased range of motion.  No back pain.    Physical Exam  BP 110/66 (BP Location: Left Arm, Patient Position: Sitting, Cuff Size: Large)   Pulse 92   Temp 98.2 F (36.8 C) (Oral)   Ht 5\' 4"  (1.626 m)   Wt 266 lb (120.7 kg)   SpO2 98%   BMI 45.66 kg/m   GEN: A/Ox3; pleasant , NAD, well nourished    HEENT:  [place]/AT,  EACs-clear, TMs-wnl, NOSE-clear, THROAT-clear, no lesions, no postnasal drip or exudate noted.   NECK:  Supple w/ fair ROM; no JVD; normal carotid impulses w/o bruits; no thyromegaly or nodules palpated; no lymphadenopathy.    RESP  Clear  P & A; w/o, wheezes/ rales/ or rhonchi. no accessory muscle use, no dullness to percussion  CARD:  RRR, no m/r/g, no peripheral edema, pulses intact, no cyanosis or clubbing.  GI:   Soft & nt; nml bowel sounds; no organomegaly or masses detected.   Musco: Warm bil, no deformities or joint swelling noted.   Neuro: alert, no focal deficits noted.    Skin: Warm, no lesions or rashes    Lab Results:  CBC    Component Value Date/Time   WBC 9.2 12/13/2020 0351   RBC 3.34 (L) 12/13/2020 0351   HGB 9.2 (L) 12/13/2020 0351   HCT 29.6 (L) 12/13/2020 0351   PLT 472 (H) 12/13/2020 0351   MCV 88.6 12/13/2020 0351   MCH 27.5 12/13/2020 0351   MCHC 31.1 12/13/2020 0351   RDW 13.6 12/13/2020 0351   LYMPHSABS 2.4 12/13/2020 0351   MONOABS 0.7 12/13/2020 0351   EOSABS 0.2 12/13/2020 0351   BASOSABS 0.1 12/13/2020 0351    BMET    Component Value Date/Time   NA 136 12/13/2020 0351   K 3.8 12/13/2020 0351   CL 101 12/13/2020 0351   CO2 28 12/13/2020 0351   GLUCOSE 92 12/13/2020 0351   BUN 10 12/13/2020 0351   CREATININE 0.54 12/13/2020 0351    CALCIUM 8.9 12/13/2020 0351   GFRNONAA >60 12/13/2020 0351   GFRAA >60 07/22/2019 0725    BNP    Component Value Date/Time   BNP 86.6 12/07/2020 0039    ProBNP No results found for: PROBNP  Imaging: DG Abd 1 View  Result Date: 12/07/2020 CLINICAL DATA:  Enteric catheter placement EXAM: ABDOMEN - 1 VIEW COMPARISON:  None. FINDINGS: Frontal view of the abdomen and pelvis was obtained. The hemidiaphragms and right flank are excluded by collimation. Tip and side port of an enteric catheter project over the gastric antrum. Nonspecific gaseous distention of the small bowel. IMPRESSION: 1. Enteric catheter projecting over the gastric antrum. 2. Nonspecific gaseous distention of the bowel. Electronically Signed   By: Sharlet Salina M.D.   On: 12/07/2020 20:50   CT  HEAD WO CONTRAST ( )  Result Date: 12/12/2020 CLINICAL DATA:  Headache. EXAM: CT HEAD WITHOUT CONTRAST TECHNIQUE: Contiguous axial images were obtained from the base of the skull through the vertex without intravenous contrast. COMPARISON:  None. FINDINGS: Brain: No evidence of acute infarction, hemorrhage, hydrocephalus, extra-axial collection or mass lesion/mass effect. Vascular: No hyperdense vessel or unexpected calcification. Skull: Normal. Negative for fracture or focal lesion. Sinuses/Orbits: There is mucosal thickening and fluid in the sphenoid sinuses and posterior ethmoid air cells. Minimal opacification of scattered mastoid air cells without bony erosion. Other: None. IMPRESSION: 1. No acute intracranial abnormalities. 2. Sinus disease as above. Electronically Signed   By: Gerome Sam III M.D.   On: 12/12/2020 06:48   CT Angio Chest PE W and/or Wo Contrast  Result Date: 12/05/2020 CLINICAL DATA:  Shortness of breath, chest pain. Normal white blood cell count. Elevated brain natriuretic peptide. EXAM: CT ANGIOGRAPHY CHEST WITH CONTRAST TECHNIQUE: Multidetector CT imaging of the chest was performed using the standard  protocol during bolus administration of intravenous contrast. Multiplanar CT image reconstructions and MIPs were obtained to evaluate the vascular anatomy. CONTRAST:  80mL OMNIPAQUE IOHEXOL 350 MG/ML SOLN COMPARISON:  X-ray 12/05/2020 FINDINGS: Cardiovascular: Satisfactory opacification of the pulmonary arteries to the segmental level. No evidence of pulmonary embolism. Thoracic aorta is normal in course and caliber. Normal heart size. No pericardial effusion. Mediastinum/Nodes: No enlarged mediastinal, hilar, or axillary lymph nodes. Thyroid gland, trachea, and esophagus demonstrate no significant findings. Lungs/Pleura: Extensive dense airspace consolidations and surrounding ground-glass opacity throughout both lungs in a predominantly perihilar and bibasilar distribution. Trace bilateral pleural effusions. No pneumothorax. Upper Abdomen: No acute abnormality. Musculoskeletal: No chest wall abnormality. No acute or significant osseous findings. Review of the MIP images confirms the above findings. IMPRESSION: 1. No evidence of pulmonary embolism. 2. Extensive dense airspace consolidations and surrounding ground-glass opacity throughout both lungs in a predominantly perihilar and bibasilar distribution. Findings could reflect pulmonary edema versus multifocal pneumonia. 3. Trace bilateral pleural effusions. Electronically Signed   By: Duanne Guess D.O.   On: 12/05/2020 19:09   US PELVIS TRANSVAGINAL NON-OB (TV ONLY)  Result Date: 12/07/2020 CLINICAL DATA:  Recent delivery assess for retained products EXAM: ULTRASOUND PELVIS TRANSVAGINAL TECHNIQUE: Transvaginal ultrasound examination of the pelvis was performed including evaluation of the uterus, ovaries, adnexal regions, and pelvic cul-de-sac. COMPARISON:  None. FINDINGS: Uterus Measurements: 11.3 x 6.8 x 7.4 cm = volume: 296.1 mL. No fibroids or other mass visualized. Endometrium Thickness: 18 mm. Moderate complex fluid in the endometrial canal. No  significant vascularity. No focal mass Right ovary Not seen Left ovary Not seen. Other findings:  Trace free fluid IMPRESSION: 1. Endometrial thickness of 18 mm with moderate complex fluid in the endometrial canal, likely blood products. No focal mass or increased vascularity. Findings are nonspecific and could indicate endometrial blood products versus hypovascular retained products of conception. Consider short-term follow-up pelvic ultrasound or further evaluation with pelvic MRI with and without contrast as clinically warranted. 2. Nonvisualized ovaries Electronically Signed   By: Jasmine Pang M.D.   On: 12/07/2020 22:13   DG Chest Port 1 View  Result Date: 12/12/2020 CLINICAL DATA:  Respiratory failure EXAM: PORTABLE CHEST 1 VIEW COMPARISON:  December 11, 2020 FINDINGS: The ET tube is in good position. The NG tube terminates below today's film. A right PICC line terminates in the central SVC. No pneumothorax. Opacity is seen in the left base. Mild increased interstitial markings bilaterally. The cardiomediastinal silhouette is stable. IMPRESSION: 1.  Support apparatus as above. 2. Pulmonary venous congestion/mild edema not excluded. 3. Focal opacity in the left base may represent pneumonia or aspiration. Recommend clinical correlation and attention on follow-up. Electronically Signed   By: Gerome Sam III M.D.   On: 12/12/2020 06:08   DG CHEST PORT 1 VIEW  Result Date: 12/11/2020 CLINICAL DATA:  25 year old female status post intubation. EXAM: PORTABLE CHEST 1 VIEW COMPARISON:  Chest x-ray 12/10/2020. FINDINGS: An endotracheal tube is in place with tip 2.7 cm above the carina. There is a right upper extremity PICC with tip terminating in the superior cavoatrial junction. A nasogastric tube is seen extending into the stomach, however, the tip of the nasogastric tube extends below the lower margin of the image. Lung volumes are low. No consolidative airspace disease. No pleural effusions. No  pneumothorax. No pulmonary nodule or mass noted. Pulmonary vasculature and the cardiomediastinal silhouette are within normal limits. IMPRESSION: 1. Support apparatus, as above. 2. Low lung volumes without radiographic evidence of acute cardiopulmonary disease. Electronically Signed   By: Trudie Reed M.D.   On: 12/11/2020 13:29   DG CHEST PORT 1 VIEW  Result Date: 12/10/2020 CLINICAL DATA:  Intubation. EXAM: PORTABLE CHEST 1 VIEW COMPARISON:  Chest x-ray 12/07/2020 FINDINGS: Exam limited by clothing artifact. The endotracheal tube is 1.5 cm above the carina. The right PICC line tip is in the SVC. The NG tube is coursing down the esophagus and into the stomach. Very low lung volumes with vascular crowding and atelectasis but overall improved aeration when compared to the prior study possibly due to resolving pulmonary edema. No pneumothorax. IMPRESSION: 1. Support apparatus in good position without complicating features. 2. Very low lung volumes with vascular crowding and atelectasis. 3. Improved aeration since prior chest x-ray possibly due to improving pulmonary edema. Electronically Signed   By: Rudie Meyer M.D.   On: 12/10/2020 13:52   DG Chest Port 1 View  Result Date: 12/07/2020 CLINICAL DATA:  Central line placement. EXAM: PORTABLE CHEST 1 VIEW COMPARISON:  Chest x-ray 12/07/2020 9:20 p.m., CT chest 12/05/2020 FINDINGS: Left internal jugular central venous catheter retraction/replacement with tip now overlying the expected region of the superior vena cava and left brachiocephalic vein confluence. Partially visualized right internal jugular central venous catheter with tip terminating within the expected region of the right axillary vein. Endotracheal tube is again noted terminating approximately 2.5 cm above the carina. Enteric tube courses below the hemidiaphragm with tip and side port collimated off view. Prominent cardiac silhouette. The heart and mediastinal contours are unchanged. Low lung  volumes. Redemonstration of diffuse right lung airspace opacities. Retrocardiac airspace opacity with air bronchograms. Slightly more conspicuous left upper lobe airspace opacity. Likely right pleural effusion. No definite left pleural effusion. No pneumothorax. No acute osseous abnormality. IMPRESSION: 1. Left internal jugular central venous catheter retraction/replacement with tip now overlying the expected region of the superior vena cava and left brachiocephalic vein confluence. 2. Partially visualized right internal jugular central venous catheter with tip terminating within the expected region of the right axillary vein. 3. Low lung volumes with bilateral, right greater than left, pulmonary airspace opacities. 4. Other lines and tubes are stable. Electronically Signed   By: Tish Frederickson M.D.   On: 12/07/2020 23:05   DG CHEST PORT 1 VIEW  Result Date: 12/07/2020 CLINICAL DATA:  Central venous catheter placement EXAM: PORTABLE CHEST 1 VIEW COMPARISON:  7:08 p.m. FINDINGS: Left internal jugular central venous catheter tip appears unchanged with its tip overlying the expected  right subclavian vein. Right internal jugular central venous catheter has been placed with its tip overlying the expected rightl axillary vein. Endotracheal tube is unchanged 3.2 cm above the carina. Nasogastric tube extends into the upper abdomen beyond the margin of the examination. Lung volumes are small and pulmonary insufflation has diminished since prior examination. Superimposed asymmetric moderate to severe airspace infiltrate, more severe within the right lung, appears stable, likely infectious or inflammatory. No pneumothorax or pleural effusion. Cardiac size within normal limits. IMPRESSION: Right internal jugular central venous catheter tip within the right axillary vein. Left internal jugular central venous catheter tip unchanged within the right subclavian vein. Otherwise stable support tubes. Progressive pulmonary  hypoinflation. Stable superimposed extensive asymmetric pulmonary infiltrate. These results will be called to the ordering clinician or representative by the Radiologist Assistant, and communication documented in the PACS or [employer]. Electronically Signed   By: Helyn Numbers M.D.   On: 12/07/2020 21:34   DG CHEST PORT 1 VIEW  Result Date: 12/07/2020 CLINICAL DATA:  Intravenous line placed and OG tube EXAM: PORTABLE CHEST 1 VIEW COMPARISON:  Chest radiograph obtained earlier the same day FINDINGS: The endotracheal tube is in the thoracic trachea approximately 3.4 cm from the carina. The enteric catheter tip is off the field of view. A left IJ central venous catheter is abnormally positioned probably in the right subclavian vein. The cardiomediastinal silhouette is stable. There is persistent confluent opacity throughout the lungs, right significantly worse than left. There is no significant pleural effusion. There is no appreciable pneumothorax. There is no acute osseous abnormality. IMPRESSION: 1. Left IJ central venous catheter is malpositioned probably in the right subclavian vein. Recommend repositioning. 2. Confluent opacity throughout the right lung with relative sparing at the left lung is not significantly changed. Differential again includes multifocal infection, ARDS, or pulmonary edema. These results will be called to the ordering clinician or representative by the Radiologist Assistant, and communication documented in the PACS or [employer]. Electronically Signed   By: Lesia Hausen M.D.   On: 12/07/2020 19:46   DG CHEST PORT 1 VIEW  Result Date: 12/07/2020 CLINICAL DATA:  25 year old female with hypoxia. Recent C-section 10 days ago. Presenting with respiratory distress. EXAM: PORTABLE CHEST 1 VIEW COMPARISON:  Portable chest 12/06/2020 and earlier. FINDINGS: Portable AP upright view at 0828 hours. Ongoing widespread and confluent bilateral pulmonary opacity with some central air  bronchograms. The right lung remains more affected. Ventilation in the left upper lobe has mildly improved since yesterday. No pneumothorax. No pleural effusion is evident. Stable cardiac size and mediastinal contours. Visualized tracheal air column is within normal limits. Negative visible bowel gas pattern. No acute osseous abnormality identified. IMPRESSION: Ongoing widespread and confluent bilateral pulmonary opacity. Differential considerations include severe pneumonia, ARDS, less likely asymmetric pulmonary edema. Mildly improved left upper lobe ventilation since yesterday. Electronically Signed   By: Odessa Fleming M.D.   On: 12/07/2020 08:35   DG Chest Port 1 View  Result Date: 12/06/2020 CLINICAL DATA:  Short of breath.  Hypoxia.  Follow-up exam. EXAM: PORTABLE CHEST 1 VIEW COMPARISON:  12/06/2020 at 1:32 a.m., and older studies. FINDINGS: Bilateral airspace lung opacities are without significant change from the most recent prior study allowing for differences in patient positioning and technique. No new lung abnormalities. No convincing pleural effusion or pneumothorax. Cardiac silhouette is mildly enlarged. IMPRESSION: 1. No convincing change from the exam obtained earlier today, allowing for radiographic and patient positioning differences. Persistent bilateral airspace lung opacities. Electronically  Signed   By: Amie Portland M.D.   On: 12/06/2020 09:44   DG CHEST PORT 1 VIEW  Result Date: 12/06/2020 CLINICAL DATA:  Chest pain and shortness of breath. EXAM: PORTABLE CHEST 1 VIEW COMPARISON:  Radiograph and CT yesterday. FINDINGS: Worsening bilateral airspace disease. Developing central air bronchograms in the right and lower lobe bronchograms on the left. Pleural effusions on CT are not well seen by radiograph. Lung volumes are low. Stable heart size and mediastinal contours. No pneumothorax. IMPRESSION: Worsening bilateral airspace disease, which may be progressive pneumonia or pulmonary edema.  Pleural effusions on CT are not well seen by radiograph. Electronically Signed   By: Narda Rutherford M.D.   On: 12/06/2020 02:15   DG CHEST PORT 1 VIEW  Result Date: 12/05/2020 CLINICAL DATA:  Shortness of breath EXAM: PORTABLE CHEST 1 VIEW COMPARISON:  02/14/2011 FINDINGS: Low lung volumes. Borderline to mild cardiomegaly. Perihilar and lower lung airspace disease. No definitive effusion. No pneumothorax. IMPRESSION: Low lung volumes. Borderline cardiomegaly with moderate bilateral mid to lower lung airspace disease, favor pneumonia over edema. Electronically Signed   By: Jasmine Pang M.D.   On: 12/05/2020 16:38   ECHOCARDIOGRAM COMPLETE  Result Date: 12/05/2020    ECHOCARDIOGRAM REPORT   Patient Name:   DAWNELLE WARMAN Date of Exam: 12/05/2020 Medical Rec #:  [id number]           Height:       64.0 in Accession #:    [id number]          Weight:       270.0 lb Date of Birth:  1996/01/10           BSA:          2.223 m Patient Age:    25 years            BP:           135/69 mmHg Patient Gender: F                   HR:           65 bpm. Exam Location:  Inpatient Procedure: 2D Echo STAT ECHO Indications:    shortness of breath  History:        Patient has no prior history of Echocardiogram examinations.                 Signs/Symptoms:postpartum complications.  Sonographer:    Delcie Roch RDCS Referring Phys: [phone] CAROLINE M NEILL IMPRESSIONS  1. Left ventricular ejection fraction, by estimation, is 60 to 65%. The left ventricle has normal function. The left ventricle has no regional wall motion abnormalities. Left ventricular diastolic parameters were normal.  2. Right ventricular systolic function is normal. The right ventricular size is normal. There is mildly elevated pulmonary artery systolic pressure.  3. The mitral valve is normal in structure. Mild mitral valve regurgitation. No evidence of mitral stenosis.  4. The tricuspid valve is abnormal.  5. The aortic valve is tricuspid. Aortic  valve regurgitation is not visualized. No aortic stenosis is present.  6. The inferior vena cava is dilated in size with >50% respiratory variability, suggesting right atrial pressure of 8 mmHg. FINDINGS  Left Ventricle: Left ventricular ejection fraction, by estimation, is 60 to 65%. The left ventricle has normal function. The left ventricle has no regional wall motion abnormalities. The left ventricular internal cavity size was normal in size. There is  no left ventricular hypertrophy. Left ventricular diastolic  parameters were normal. Right Ventricle: The right ventricular size is normal. No increase in right ventricular wall thickness. Right ventricular systolic function is normal. There is mildly elevated pulmonary artery systolic pressure. The tricuspid regurgitant velocity is 2.93  m/s, and with an assumed right atrial pressure of 8 mmHg, the estimated right ventricular systolic pressure is 42.3 mmHg. Left Atrium: Left atrial size was normal in size. Right Atrium: Right atrial size was normal in size. Pericardium: There is no evidence of pericardial effusion. Mitral Valve: The mitral valve is normal in structure. Mild mitral valve regurgitation. No evidence of mitral valve stenosis. Tricuspid Valve: The tricuspid valve is abnormal. Tricuspid valve regurgitation is mild . No evidence of tricuspid stenosis. Aortic Valve: The aortic valve is tricuspid. Aortic valve regurgitation is not visualized. No aortic stenosis is present. Pulmonic Valve: The pulmonic valve was not well visualized. Pulmonic valve regurgitation is trivial. No evidence of pulmonic stenosis. Aorta: The aortic root is normal in size and structure. Venous: The inferior vena cava is dilated in size with greater than 50% respiratory variability, suggesting right atrial pressure of 8 mmHg. IAS/Shunts: The interatrial septum was not well visualized.  LEFT VENTRICLE PLAX 2D LVIDd:         4.90 cm  Diastology LVIDs:         3.20 cm  LV e' medial:     11.30 cm/s LV PW:         1.10 cm  LV E/e' medial:  10.3 LV IVS:        1.00 cm  LV e' lateral:   13.30 cm/s LVOT diam:     2.00 cm  LV E/e' lateral: 8.7 LV SV:         72 LV SV Index:   32 LVOT Area:     3.14 cm  RIGHT VENTRICLE             IVC RV S prime:     14.10 cm/s  IVC diam: 2.40 cm TAPSE (M-mode): 2.4 cm LEFT ATRIUM             Index       RIGHT ATRIUM           Index LA diam:        4.20 cm 1.89 cm/m  RA Area:     16.20 cm LA Vol (A2C):   75.7 ml 34.06 ml/m RA Volume:   42.50 ml  19.12 ml/m LA Vol (A4C):   56.2 ml 25.29 ml/m LA Biplane Vol: 67.0 ml 30.15 ml/m  AORTIC VALVE LVOT Vmax:   116.00 cm/s LVOT Vmean:  76.500 cm/s LVOT VTI:    0.228 m  AORTA Ao Root diam: 3.10 cm Ao Asc diam:  3.10 cm MITRAL VALVE                TRICUSPID VALVE MV Area (PHT): 4.39 cm     TR Peak grad:   34.3 mmHg MV Decel Time: 173 msec     TR Vmax:        293.00 cm/s MV E velocity: 116.00 cm/s MV A velocity: 90.60 cm/s   SHUNTS MV E/A ratio:  1.28         Systemic VTI:  0.23 m                             Systemic Diam: 2.00 cm Dina Rich MD Electronically signed by Dina Rich MD Signature Date/Time:  12/05/2020/5:40:11 PM    Final    VAS [place] LOWER EXTREMITY VENOUS (DVT)  Result Date: 12/07/2020  Lower Venous DVT Study Patient Name:  NAIROBI GUSTAFSON  Date of Exam:   12/06/2020 Medical Rec #: [id number]            Accession #:    [id number] Date of Birth: 1995-05-10            Patient Gender: F Patient Age:   67 years Exam Location:  [facility] Procedure:      VAS [place] LOWER EXTREMITY VENOUS (DVT) Referring Phys: Melody Comas --------------------------------------------------------------------------------  Indications: Swelling, and SOB. Other Indications: 10 days post partum. Limitations: Body habitus and coughing, bed would not remain elevated. Comparison Study: No prior study Performing Technologist: Sherren Kerns RVS  Examination Guidelines: A complete evaluation includes B-mode imaging, spectral  Doppler, color Doppler, and power Doppler as needed of all accessible portions of each vessel. Bilateral testing is considered an integral part of a complete examination. Limited examinations for reoccurring indications may be performed as noted. The reflux portion of the exam is performed with the patient in reverse Trendelenburg.  +---------+---------------+---------+-----------+----------+-------------------+ RIGHT    CompressibilityPhasicitySpontaneityPropertiesThrombus Aging      +---------+---------------+---------+-----------+----------+-------------------+ CFV      Full           Yes      Yes                                      +---------+---------------+---------+-----------+----------+-------------------+ FV Prox                 Yes      Yes                  patent by color and                                                       Doppler             +---------+---------------+---------+-----------+----------+-------------------+ FV Mid                                                Not well visualized +---------+---------------+---------+-----------+----------+-------------------+ FV Distal               Yes      Yes                  patent by color and                                                       Doppler             +---------+---------------+---------+-----------+----------+-------------------+ PFV  Not well visualized +---------+---------------+---------+-----------+----------+-------------------+ POP      Full           Yes      Yes                                      +---------+---------------+---------+-----------+----------+-------------------+ PTV      Full                                                             +---------+---------------+---------+-----------+----------+-------------------+ PERO     Full                                                              +---------+---------------+---------+-----------+----------+-------------------+   +---------+---------------+---------+-----------+----------+-------------------+ LEFT     CompressibilityPhasicitySpontaneityPropertiesThrombus Aging      +---------+---------------+---------+-----------+----------+-------------------+ CFV                     Yes      Yes                  patent by color and                                                       Doppler             +---------+---------------+---------+-----------+----------+-------------------+ FV Prox                 Yes      Yes                  patent by color and                                                       Doppler             +---------+---------------+---------+-----------+----------+-------------------+ FV Mid                  Yes      Yes                  patent by color and                                                       Doppler             +---------+---------------+---------+-----------+----------+-------------------+ FV Distal               Yes      Yes  patent by color and                                                       Doppler             +---------+---------------+---------+-----------+----------+-------------------+ PFV                                                   Not well visualized +---------+---------------+---------+-----------+----------+-------------------+ POP      Full           Yes      Yes                                      +---------+---------------+---------+-----------+----------+-------------------+ PTV                                                   Not well visualized +---------+---------------+---------+-----------+----------+-------------------+ PERO                                                  Not well visualized  +---------+---------------+---------+-----------+----------+-------------------+     Summary: RIGHT: - There is no evidence of deep vein thrombosis in the lower extremity. However, portions of this examination were limited- see technologist comments above.  LEFT: - There is no evidence of deep vein thrombosis in the lower extremity. However, portions of this examination were limited- see technologist comments above.  *See table(s) above for measurements and observations. Electronically signed by Gerarda Fraction on 12/07/2020 at 5:49:08 PM.    Final    [place] EKG SITE RITE  Result Date: 12/07/2020 If Site Rite image not attached, placement could not be confirmed due to current cardiac rhythm.     No flowsheet data found.  No results found for: NITRICOXIDE      Assessment & Plan:   Acute pulmonary edema (HCC) Recent admission with acute pulmonary edema most likely from post partum preeclampsia -severe ARDS and Acute respiratory failure  -much improved. Weight down 24lbs  Check chest xray today   Plan  Patient Instructions  Chest x-ray today Activity as tolerated.  Follow up with Dr. Celine Mans in 4 weeks and As needed         Acute respiratory failure with hypoxia (HCC) Resolved  o2 sats normal on room air   Plan  Patient Instructions  Chest x-ray today Activity as tolerated.  Follow up with Dr. Celine Mans in 4 weeks and As needed           Rubye Oaks, NP 12/15/2020

## 2020-12-15 NOTE — Assessment & Plan Note (Signed)
Recent admission with acute pulmonary edema most likely from post partum preeclampsia -severe ARDS and Acute respiratory failure  -much improved. Weight down 24lbs  Check chest xray today   Plan  Patient Instructions  Chest x-ray today Activity as tolerated.  Follow up with Dr. Celine Mans in 4 weeks and As needed

## 2020-12-21 ENCOUNTER — Telehealth: Payer: Self-pay | Admitting: *Deleted

## 2020-12-21 ENCOUNTER — Encounter: Payer: Self-pay | Admitting: *Deleted

## 2020-12-21 NOTE — Progress Notes (Signed)
ATC x2.  LM to return call.  Unable to call letter mailed.  Encounter closed per policy.

## 2020-12-22 ENCOUNTER — Telehealth: Payer: Self-pay | Admitting: Internal Medicine

## 2020-12-22 DIAGNOSIS — J181 Lobar pneumonia, unspecified organism: Secondary | ICD-10-CM

## 2020-12-22 NOTE — Telephone Encounter (Signed)
Attempted to contact patient x2, phone line sounds like it picks up then goes silent.   Will await call back. F/U appt has already been made.

## 2020-12-22 NOTE — Telephone Encounter (Signed)
Call made to patient, confirmed DOB. Made aware of CXR results. Voiced understanding.   Order ofr cxr placed for upcoming appt.   Nothing further needed at this time.

## 2021-01-12 ENCOUNTER — Ambulatory Visit (INDEPENDENT_AMBULATORY_CARE_PROVIDER_SITE_OTHER): Payer: 59 | Admitting: Internal Medicine

## 2021-01-12 ENCOUNTER — Encounter: Payer: Self-pay | Admitting: Internal Medicine

## 2021-01-12 ENCOUNTER — Ambulatory Visit (INDEPENDENT_AMBULATORY_CARE_PROVIDER_SITE_OTHER): Payer: 59

## 2021-01-12 ENCOUNTER — Ambulatory Visit: Payer: 59

## 2021-01-12 ENCOUNTER — Ambulatory Visit: Payer: 59 | Admitting: Adult Health

## 2021-01-12 ENCOUNTER — Other Ambulatory Visit: Payer: Self-pay

## 2021-01-12 VITALS — BP 124/70 | HR 77 | Temp 98.1°F | Ht 64.0 in | Wt 261.0 lb

## 2021-01-12 DIAGNOSIS — Z23 Encounter for immunization: Secondary | ICD-10-CM

## 2021-01-12 DIAGNOSIS — Z8709 Personal history of other diseases of the respiratory system: Secondary | ICD-10-CM | POA: Diagnosis not present

## 2021-01-12 DIAGNOSIS — J181 Lobar pneumonia, unspecified organism: Secondary | ICD-10-CM | POA: Diagnosis not present

## 2021-01-12 NOTE — Patient Instructions (Addendum)
Please schedule follow up scheduled with myself as needed.  Glad you are feeling better! Take good care and call me if any concerns.

## 2021-01-12 NOTE — Progress Notes (Signed)
         Melanie Burch    [id number]    [birth year]  Primary Care Physician:Patient, No Pcp Per (Inactive) Date of Appointment: 01/12/2021 Established Patient Visit  Chief complaint:   Chief Complaint  Patient presents with   Follow-up    Chest xray performed today. Pt states she has been doing much better since last visit and denies any complaints.     HPI: Melanie Burch is a 25 y.o. woman who was hospitalized after post-partum course complicated by pre-eclampsia, chorioamniotis, c-section, respiratory failure and ARDS. She had a week long ICU stay. Was extubated.   Interval Updates: Here for follow up. Saw Tammy Parrett for follow up. Was dyspnea and low energy/weakness.  Doing well. Appetite is good, energy level improved, lower extremity edema resolved.  Baby boy is 10 weeks old and doing well.   I have reviewed the patient's family social and past medical history and updated as appropriate.   Past Medical History:  Diagnosis Date   GBS bacteriuria 12/05/2020   Pre-eclampsia, mild to moderate, third trimester     Past Surgical History:  Procedure Laterality Date   CESAREAN SECTION      Family History  Problem Relation Age of Onset   Pneumonia Paternal Grandfather     Social History   Occupational History   Not on file  Tobacco Use   Smoking status: Never   Smokeless tobacco: Never  Vaping Use   Vaping Use: Never used  Substance and Sexual Activity   Alcohol use: Yes    Comment: socially    Drug use: No   Sexual activity: Yes     Physical Exam: Blood pressure 124/70, pulse 77, temperature 98.1 F (36.7 C), temperature source Oral, height 5\' 4"  (1.626 m), weight 261 lb (118.4 kg), SpO2 100 %.  Gen:      No acute distress Lungs:    No increased respiratory effort, symmetric chest wall excursion, clear to auscultation bilaterally, no wheezes or crackles CV:         Regular rate and rhythm; no murmurs, rubs, or gallops.  No pedal  edema   Data Reviewed: Imaging: I have personally reviewed the chest xray from admission which shows bilateral air space opacities. Now improved. Her chest xray today shows resolution of RUL atelectasis.   PFTs: No flowsheet data found.   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 Her chest xray is also improved. She can follow up as needed.  Will give her a flu shot today.    Return to Care: As needed.    08/14/2019, MD Pulmonary and Critical Care Medicine [facility] Office:[phone]

## 2021-10-27 NOTE — Telephone Encounter (Signed)
Closing encounter

## 2022-07-03 IMAGING — CT CT HEAD W/O CM
4 series · 16 of 47 positions shown, 18 images · non-contrast
Comparison: None.

CLINICAL DATA: Headache.

EXAM:
CT HEAD WITHOUT CONTRAST
TECHNIQUE: Contiguous axial images were obtained from the base of the skull
through the vertex without intravenous contrast.

[Series 3: head without · axial · non-contrast · 0.44mm/px · z∈[+60,+180]mm · 7 of 32 slices shown, 9 images]
[im 4/32  brain]
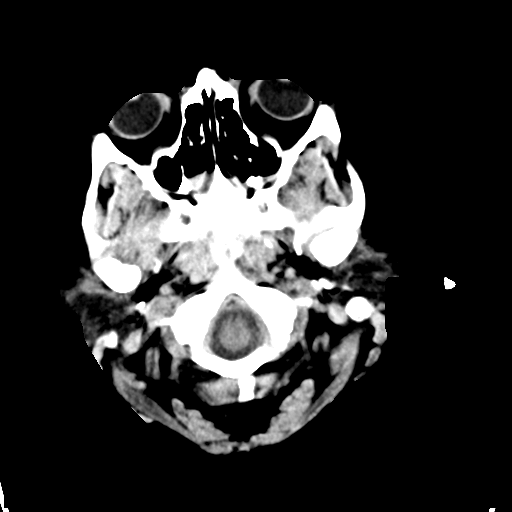
[im 4/32  bone]
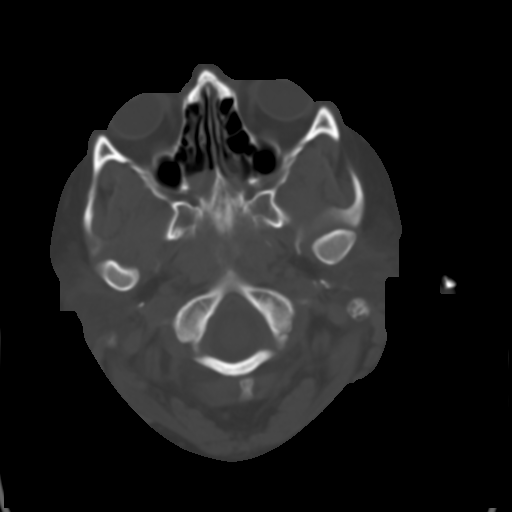
[im 8/32  brain]
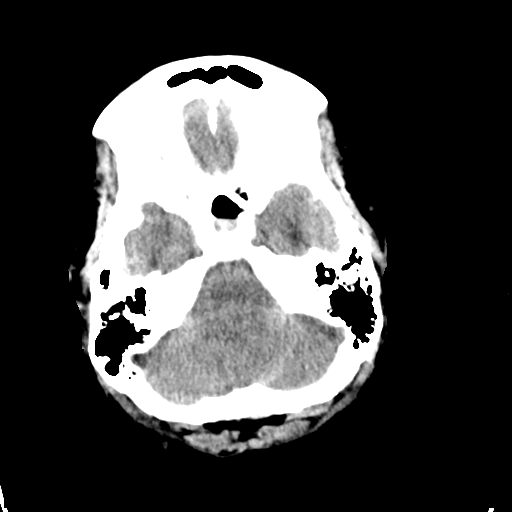
[im 12/32  brain]
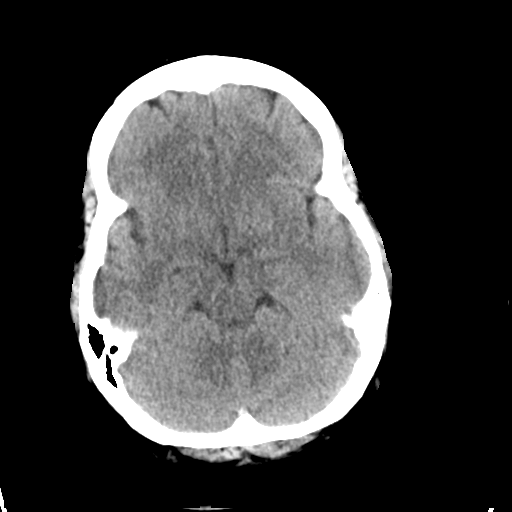
[im 16/32  brain]
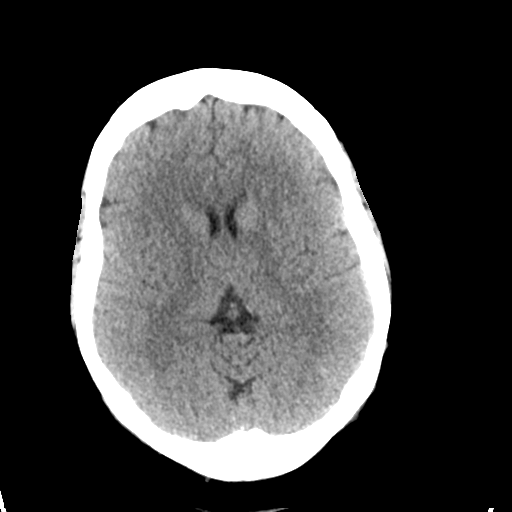
[im 20/32  brain]
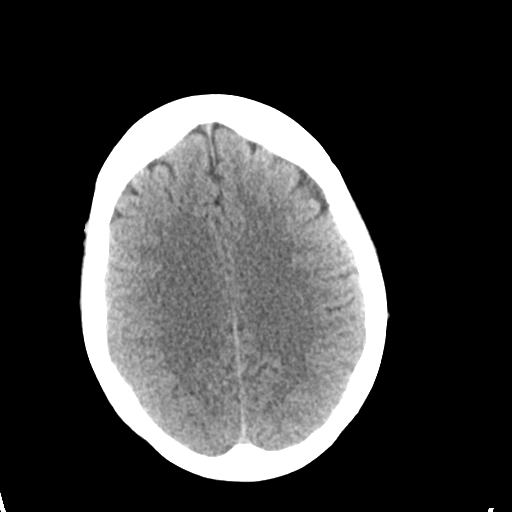
[im 20/32  bone]
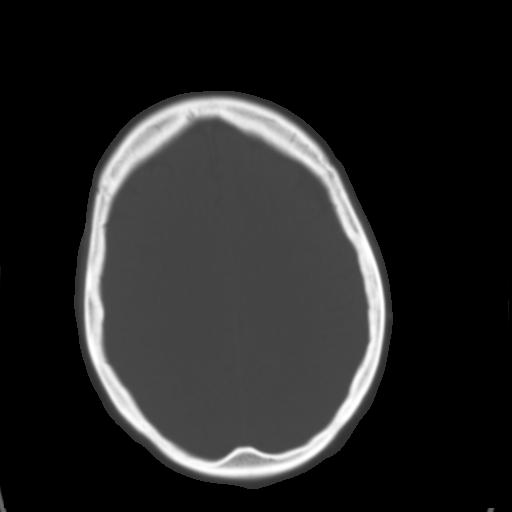
[im 24/32  brain]
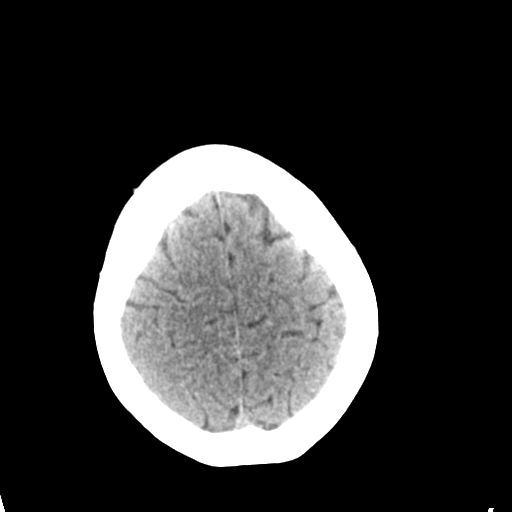
[im 28/32  brain]
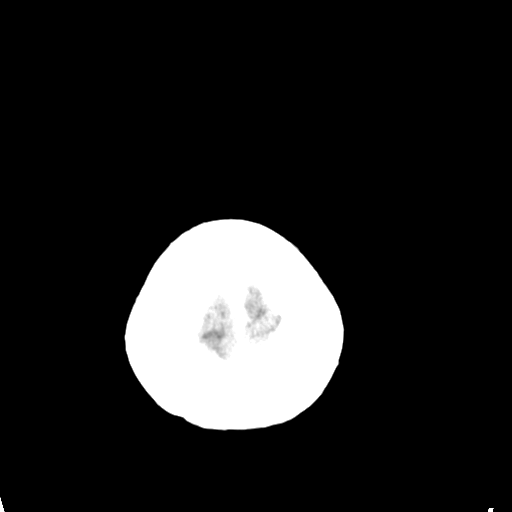

[Series 4: head bone · axial · 0.44mm/px · z∈[+59,+91]mm · 3 of 79 slices shown]
[im 8/79  bone]
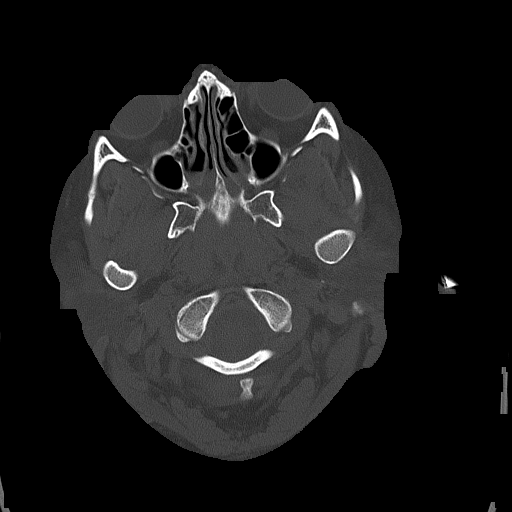
[im 16/79  bone]
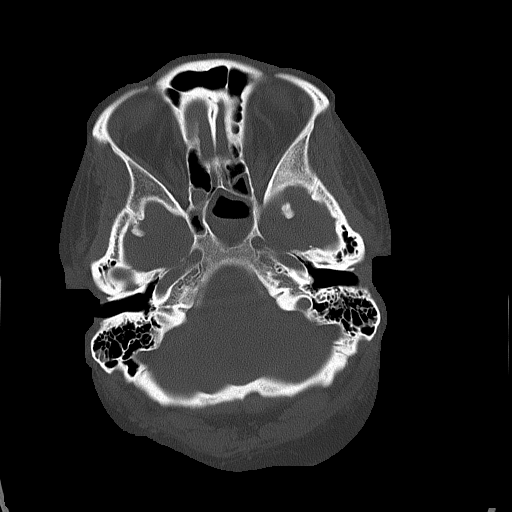
[im 24/79  bone]
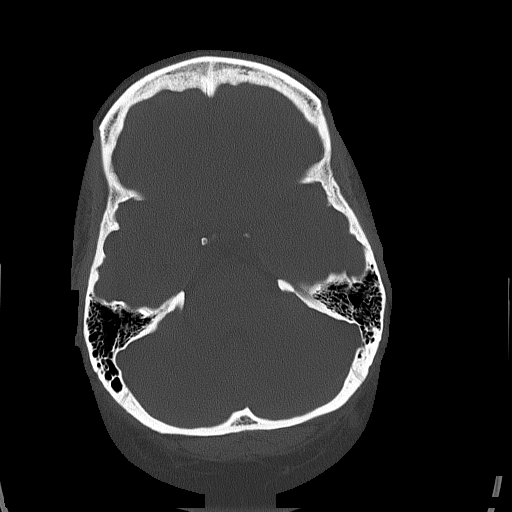

[Series 5: head without cor · coronal · non-contrast · 0.32mm/px · 3 of 62 slices shown]
[im 21/62  brain]
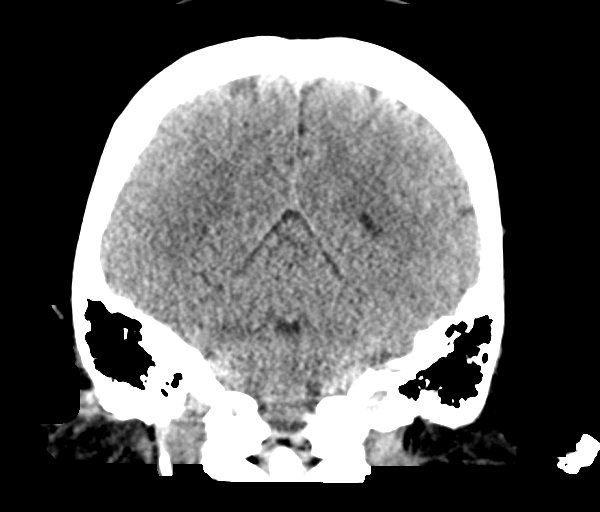
[im 28/62  brain]
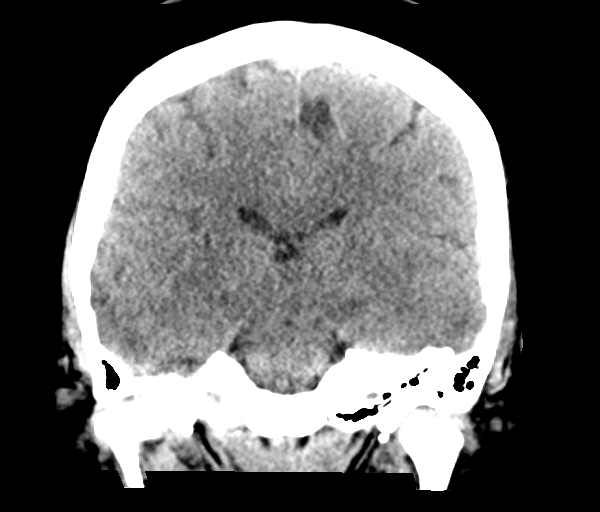
[im 34/62  brain]
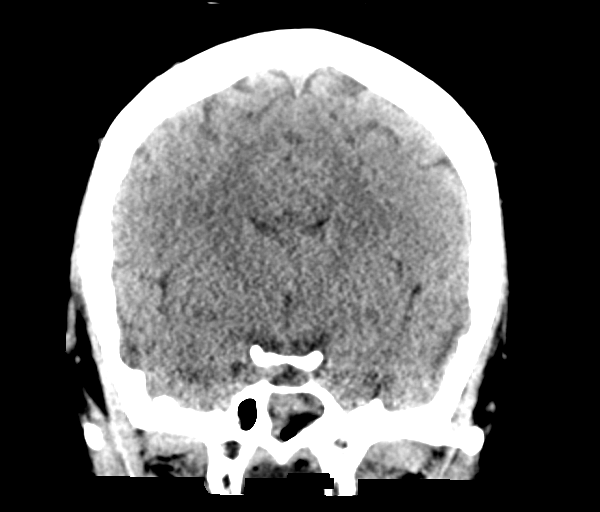

[Series 6: head without sag · sagittal · non-contrast · 0.31mm/px · 3 of 53 slices shown]
[im 18/53  brain]
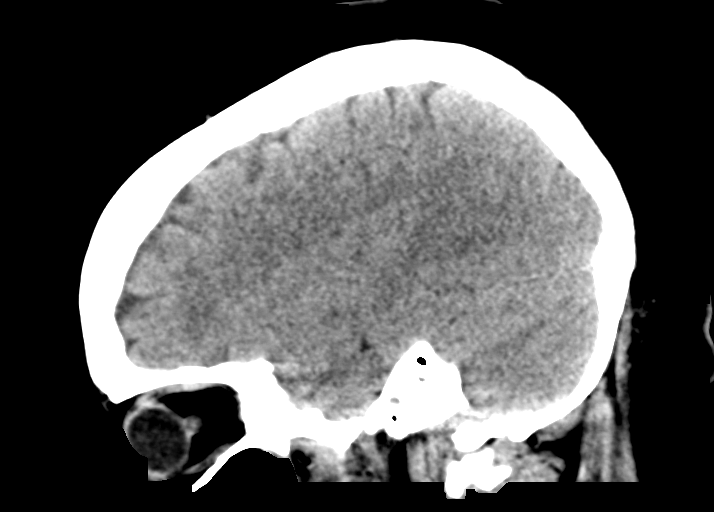
[im 27/53  brain]
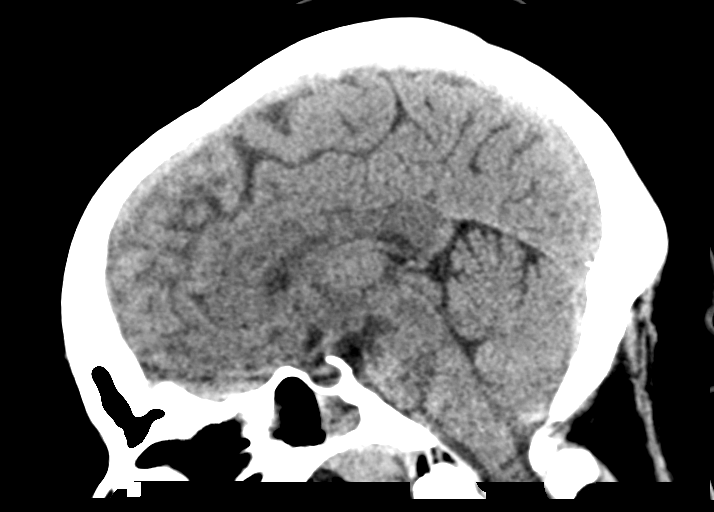
[im 35/53  brain]
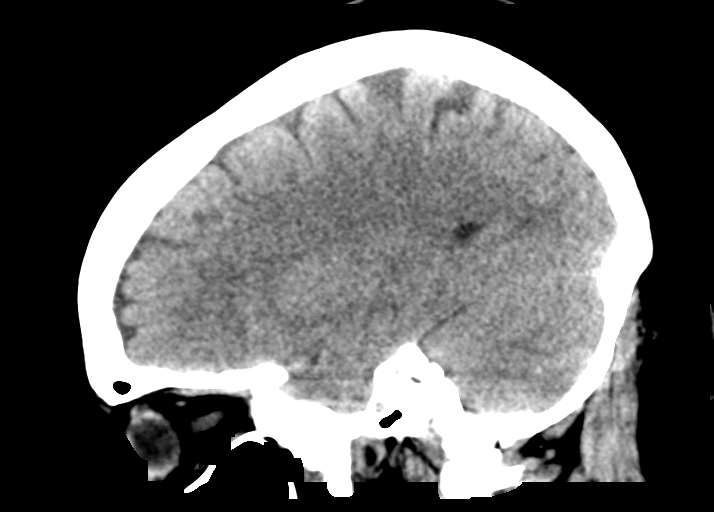

[16 of 47 positions shown; findings below may reference images not displayed]

FINDINGS: Brain: No evidence of acute infarction, hemorrhage, hydrocephalus,
extra-axial collection or mass lesion/mass effect.

Vascular: No hyperdense vessel or unexpected calcification.

Skull: Normal. Negative for fracture or focal lesion.

Sinuses/Orbits: There is mucosal thickening and fluid in the
sphenoid sinuses and posterior ethmoid air cells. Minimal
opacification of scattered mastoid air cells without bony erosion.

Other: None.
IMPRESSION: 1. No acute intracranial abnormalities.
2. Sinus disease as above.

## 2022-07-25 ENCOUNTER — Emergency Department (HOSPITAL_BASED_OUTPATIENT_CLINIC_OR_DEPARTMENT_OTHER): Payer: Managed Care, Other (non HMO)

## 2022-07-25 ENCOUNTER — Other Ambulatory Visit: Payer: Self-pay

## 2022-07-25 ENCOUNTER — Encounter (HOSPITAL_BASED_OUTPATIENT_CLINIC_OR_DEPARTMENT_OTHER): Payer: Self-pay | Admitting: Urology

## 2022-07-25 ENCOUNTER — Observation Stay (HOSPITAL_BASED_OUTPATIENT_CLINIC_OR_DEPARTMENT_OTHER)
Admission: EM | Admit: 2022-07-25 | Discharge: 2022-07-27 | Disposition: A | Discharge status: Home / Self Care | Payer: Managed Care, Other (non HMO) | Attending: General Surgery | Admitting: General Surgery

## 2022-07-25 DIAGNOSIS — K819 Cholecystitis, unspecified: Secondary | ICD-10-CM

## 2022-07-25 DIAGNOSIS — K8012 Calculus of gallbladder with acute and chronic cholecystitis without obstruction: Secondary | ICD-10-CM | POA: Diagnosis not present

## 2022-07-25 DIAGNOSIS — K810 Acute cholecystitis: Secondary | ICD-10-CM | POA: Diagnosis present

## 2022-07-25 DIAGNOSIS — R1011 Right upper quadrant pain: Secondary | ICD-10-CM | POA: Diagnosis present

## 2022-07-25 LAB — PREGNANCY, URINE: Preg Test, Ur: NEGATIVE

## 2022-07-25 LAB — CREATININE, SERUM
Creatinine, Ser: 0.6 mg/dL (ref 0.44–1.00)
GFR, Estimated: >60 mL/min (ref >60)

## 2022-07-25 LAB — CBC WITH DIFFERENTIAL/PLATELET
Abs Immature Granulocytes: 0.03 10*3/uL (ref 0.00–0.07)
Basophils Absolute: 0 10*3/uL (ref 0.0–0.1)
Basophils Relative: 0 %
Eosinophils Absolute: 0 10*3/uL (ref 0.0–0.5)
Eosinophils Relative: 0 %
HCT: 40.5 % (ref 36.0–46.0)
Hemoglobin: 13 g/dL (ref 12.0–15.0)
Immature Granulocytes: 0 %
Lymphocytes Relative: 27 %
Lymphs Abs: 2.8 10*3/uL (ref 0.7–4.0)
MCH: 27.4 pg (ref 26.0–34.0)
MCHC: 32.1 g/dL (ref 30.0–36.0)
MCV: 85.4 fL (ref 80.0–100.0)
Monocytes Absolute: 0.7 10*3/uL (ref 0.1–1.0)
Monocytes Relative: 7 %
Neutro Abs: 6.8 10*3/uL (ref 1.7–7.7)
Neutrophils Relative %: 66 %
Platelets: 389 10*3/uL (ref 150–400)
RBC: 4.74 MIL/uL (ref 3.87–5.11)
RDW: 13.4 % (ref 11.5–15.5)
WBC: 10.4 10*3/uL (ref 4.0–10.5)
nRBC: 0 % (ref 0.0–0.2)

## 2022-07-25 LAB — COMPREHENSIVE METABOLIC PANEL
ALT: 12 U/L (ref 0–44)
AST: 13 U/L — ABNORMAL LOW (ref 15–41)
Albumin: 3.7 g/dL (ref 3.5–5.0)
Alkaline Phosphatase: 107 U/L (ref 38–126)
Anion gap: 8 (ref 5–15)
BUN: 6 mg/dL (ref 6–20)
CO2: 24 mmol/L (ref 22–32)
Calcium: 8.9 mg/dL (ref 8.9–10.3)
Chloride: 101 mmol/L (ref 98–111)
Creatinine, Ser: 0.72 mg/dL (ref 0.44–1.00)
GFR, Estimated: >60 mL/min (ref >60)
Glucose, Bld: 94 mg/dL (ref 70–99)
Potassium: 3.5 mmol/L (ref 3.5–5.1)
Sodium: 133 mmol/L — ABNORMAL LOW (ref 135–145)
Total Bilirubin: 0.4 mg/dL (ref 0.3–1.2)
Total Protein: 8.7 g/dL — ABNORMAL HIGH (ref 6.5–8.1)

## 2022-07-25 LAB — URINALYSIS, ROUTINE W REFLEX MICROSCOPIC
Bilirubin Urine: NEGATIVE
Glucose, UA: NEGATIVE mg/dL
Hgb urine dipstick: NEGATIVE
Ketones, ur: NEGATIVE mg/dL
Nitrite: NEGATIVE
Protein, ur: NEGATIVE mg/dL
Specific Gravity, Urine: 1.01 (ref 1.005–1.030)
pH: 6.5 (ref 5.0–8.0)

## 2022-07-25 LAB — LIPASE, BLOOD: Lipase: 27 U/L (ref 11–51)

## 2022-07-25 LAB — URINALYSIS, MICROSCOPIC (REFLEX)

## 2022-07-25 MED ORDER — SODIUM CHLORIDE 0.9 % IV BOLUS
1000.0000 mL | Freq: Once | INTRAVENOUS | Status: AC
  Administered 2022-07-25: 1000 mL via INTRAVENOUS

## 2022-07-25 MED ORDER — ONDANSETRON 4 MG PO TBDP: 4.0000 mg | ORAL_TABLET | Freq: Four times a day (QID) | ORAL | Status: DC | PRN

## 2022-07-25 MED ORDER — FAMOTIDINE IN NACL 20-0.9 MG/50ML-% IV SOLN
20.0000 mg | Freq: Once | INTRAVENOUS | Status: AC
  Administered 2022-07-25: 20 mg via INTRAVENOUS
  Filled 2022-07-25: qty 50

## 2022-07-25 MED ORDER — ALUM & MAG HYDROXIDE-SIMETH 200-200-20 MG/5ML PO SUSP
30.0000 mL | Freq: Once | ORAL | Status: AC
  Administered 2022-07-25: 30 mL via ORAL
  Filled 2022-07-25: qty 30

## 2022-07-25 MED ORDER — DIPHENHYDRAMINE HCL 12.5 MG/5ML PO ELIX: 12.5000 mg | ORAL_SOLUTION | Freq: Four times a day (QID) | ORAL | Status: DC | PRN

## 2022-07-25 MED ORDER — MELATONIN 3 MG PO TABS: 3.0000 mg | ORAL_TABLET | Freq: Every evening | ORAL | Status: DC | PRN

## 2022-07-25 MED ORDER — ACETAMINOPHEN 500 MG PO TABS
1000.0000 mg | ORAL_TABLET | Freq: Four times a day (QID) | ORAL | Status: DC
  Administered 2022-07-25 – 2022-07-27 (×5): 1000 mg via ORAL
  Filled 2022-07-25 (×6): qty 2

## 2022-07-25 MED ORDER — KCL IN DEXTROSE-NACL 20-5-0.45 MEQ/L-%-% IV SOLN
INTRAVENOUS | Status: DC
  Filled 2022-07-25 (×2): qty 1000

## 2022-07-25 MED ORDER — OXYCODONE HCL 5 MG PO TABS
5.0000 mg | ORAL_TABLET | ORAL | Status: DC | PRN
  Administered 2022-07-25: 10 mg via ORAL
  Administered 2022-07-26: 5 mg via ORAL
  Administered 2022-07-27 (×2): 10 mg via ORAL
  Filled 2022-07-25 (×3): qty 2
  Filled 2022-07-25: qty 1

## 2022-07-25 MED ORDER — ONDANSETRON HCL 4 MG/2ML IJ SOLN
4.0000 mg | Freq: Once | INTRAMUSCULAR | Status: AC
  Administered 2022-07-25: 4 mg via INTRAVENOUS
  Filled 2022-07-25: qty 2

## 2022-07-25 MED ORDER — ENOXAPARIN SODIUM 40 MG/0.4ML IJ SOSY: 40.0000 mg | PREFILLED_SYRINGE | INTRAMUSCULAR | Status: DC

## 2022-07-25 MED ORDER — ONDANSETRON HCL 4 MG/2ML IJ SOLN: 4.0000 mg | Freq: Four times a day (QID) | INTRAMUSCULAR | Status: DC | PRN

## 2022-07-25 MED ORDER — SIMETHICONE 80 MG PO CHEW
40.0000 mg | CHEWABLE_TABLET | Freq: Four times a day (QID) | ORAL | Status: DC | PRN
  Filled 2022-07-25: qty 1

## 2022-07-25 MED ORDER — DOCUSATE SODIUM 100 MG PO CAPS
100.0000 mg | ORAL_CAPSULE | Freq: Two times a day (BID) | ORAL | Status: DC
  Administered 2022-07-25 – 2022-07-27 (×3): 100 mg via ORAL
  Filled 2022-07-25 (×3): qty 1

## 2022-07-25 MED ORDER — SODIUM CHLORIDE 0.9 % IV SOLN: 2.0000 g | INTRAVENOUS | Status: DC

## 2022-07-25 MED ORDER — SODIUM CHLORIDE 0.9 % IV SOLN
2.0000 g | Freq: Once | INTRAVENOUS | Status: AC
  Administered 2022-07-25: 2 g via INTRAVENOUS
  Filled 2022-07-25: qty 20

## 2022-07-25 MED ORDER — PANTOPRAZOLE SODIUM 40 MG IV SOLR
40.0000 mg | Freq: Every day | INTRAVENOUS | Status: DC
  Administered 2022-07-25: 40 mg via INTRAVENOUS
  Filled 2022-07-25: qty 10

## 2022-07-25 MED ORDER — DIPHENHYDRAMINE HCL 50 MG/ML IJ SOLN: 12.5000 mg | Freq: Four times a day (QID) | INTRAMUSCULAR | Status: DC | PRN

## 2022-07-25 MED ORDER — MORPHINE SULFATE (PF) 2 MG/ML IV SOLN
1.0000 mg | INTRAVENOUS | Status: DC | PRN
  Administered 2022-07-25: 2 mg via INTRAVENOUS
  Filled 2022-07-25: qty 1

## 2022-07-25 NOTE — ED Provider Notes (Signed)
[place] [facility] Provider Note   CSN: [id number] Arrival date & time: 07/25/22  1243     History  Chief Complaint  Patient presents with   Abdominal Pain    Melanie Burch is a 27 y.o. female who presents emergency department with concerns for upper abdominal pain onset yesterday.  Notes a burning sensation that is localized to her upper abdomen and right upper quadrant region.  Still has her gallbladder.  Has tried Tums and Pepto-Bismol at home for her symptoms.  Also tried MiraLAX.  Notes that she has had associated nausea, vomiting.  Her last bowel movement was 2 days ago.  Denies chest pain, shortness of breath, urinary symptoms, diarrhea.  The history is provided by the patient. No language interpreter was used.       Home Medications Prior to Admission medications   Medication Sig Start Date End Date Taking? Authorizing Provider  valACYclovir (VALTREX) 1000 MG tablet Take 1 tablet (1,000 mg total) by mouth 2 (two) times daily. 09/15/19   Horton, Mayer Masker, MD      Allergies    Patient has no known allergies.    Review of Systems   Review of Systems  Respiratory:  Negative for shortness of breath.   Cardiovascular:  Negative for chest pain.  Gastrointestinal:  Positive for abdominal pain, constipation (x 2 days with last BM), nausea and vomiting.  Genitourinary:  Negative for dysuria and hematuria.  All other systems reviewed and are negative.   Physical Exam Updated Vital Signs BP (!) 147/99 (BP Location: Left Arm)   Pulse 82   Temp 98.2 F (36.8 C)   Resp 20   Ht 5\' 4"  (1.626 m)   Wt 113.4 kg   LMP 07/22/2022   SpO2 98%   BMI 42.91 kg/m  Physical Exam Vitals and nursing note reviewed.  Constitutional:      General: She is not in acute distress.    Appearance: She is not diaphoretic.  HENT:     Head: Normocephalic and atraumatic.     Mouth/Throat:     Pharynx: No oropharyngeal exudate.  Eyes:      General: No scleral icterus.    Conjunctiva/sclera: Conjunctivae normal.  Cardiovascular:     Rate and Rhythm: Normal rate and regular rhythm.     Pulses: Normal pulses.     Heart sounds: Normal heart sounds.  Pulmonary:     Effort: Pulmonary effort is normal. No respiratory distress.     Breath sounds: Normal breath sounds. No wheezing.  Abdominal:     General: Bowel sounds are normal.     Palpations: Abdomen is soft. There is no mass.     Tenderness: There is abdominal tenderness in the right upper quadrant. There is no guarding or rebound. Positive signs include Murphy's sign.  Musculoskeletal:        General: Normal range of motion.     Cervical back: Normal range of motion and neck supple.  Skin:    General: Skin is warm and dry.  Neurological:     Mental Status: She is alert.  Psychiatric:        Behavior: Behavior normal.     ED Results / Procedures / Treatments   Labs (all labs ordered are listed, but only abnormal results are displayed) Labs Reviewed  COMPREHENSIVE METABOLIC PANEL - Abnormal; Notable for the following components:      Result Value   Sodium 133 (*)    Total  Protein 8.7 (*)    AST 13 (*)    All other components within normal limits  URINALYSIS, ROUTINE W REFLEX MICROSCOPIC - Abnormal; Notable for the following components:   Leukocytes,Ua TRACE (*)    All other components within normal limits  URINALYSIS, MICROSCOPIC (REFLEX) - Abnormal; Notable for the following components:   Bacteria, UA FEW (*)    All other components within normal limits  CBC WITH DIFFERENTIAL/PLATELET  LIPASE, BLOOD  PREGNANCY, URINE  HIV ANTIBODY (ROUTINE TESTING W REFLEX)    EKG None  Radiology US Abdomen Limited RUQ (LIVER/GB)  Result Date: 07/25/2022 CLINICAL DATA:  Burning pain in the stomach associated with nausea and vomiting EXAM: ULTRASOUND ABDOMEN LIMITED RIGHT UPPER QUADRANT COMPARISON:  None Available. FINDINGS: Gallbladder: Cholelithiasis. Mild gallbladder  wall thickening. No sonographic Murphy sign noted by sonographer. Common bile duct: Diameter: 2 mm Liver: No focal lesion identified. Within normal limits in parenchymal echogenicity. Portal vein is patent on color Doppler imaging with normal direction of blood flow towards the liver. Other: None. IMPRESSION: Cholelithiasis with mild gallbladder wall thickening. No sonographic Murphy sign. Findings are equivocal for acute cholecystitis. Electronically Signed   By: Agustin Cree M.D.   On: 07/25/2022 15:58    Procedures Procedures    Medications Ordered in ED Medications  sodium chloride 0.9 % bolus 1,000 mL (1,000 mLs Intravenous New Bag/Given 07/25/22 1440)  ondansetron (ZOFRAN) injection 4 mg (4 mg Intravenous Given 07/25/22 1442)  alum & mag hydroxide-simeth (MAALOX/MYLANTA) 200-200-20 MG/5ML suspension 30 mL (30 mLs Oral Given 07/25/22 1441)  famotidine (PEPCID) IVPB 20 mg premix (20 mg Intravenous New Bag/Given 07/25/22 1441)    ED Course/ Medical Decision Making/ A&P Clinical Course as of 07/25/22 1743  Mon Jul 25, 2022  1532 Pt re-evaluated and notes improvement of her symptoms with treatment regimen in the ED. [SB]  1707 Consult with surgery, Dr. Andrey Campanile reached out to oncoming surgeon, Dr. Dwain Sarna regarding patient's care.  Patient to be admitted to surgery service for cholecystectomy. [SB]  1743 Discussed with patient plans for admission and cholecystectomy, patient agreeable at this time. [SB]    Clinical Course User Index [SB] Carlo Guevarra A, PA-C                             Medical Decision Making Amount and/or Complexity of Data Reviewed Labs: ordered. Radiology: ordered.  Risk OTC drugs. Prescription drug management. Decision regarding hospitalization.   Patient presents to the emergency department with upper abdominal pain onset yesterday.  Patient notes that it is a burning sensation.  Still has her gallbladder.  Patient afebrile.  On exam patient with right-sided  abdominal tenderness to palpation, more so to right upper quadrant.  Differential diagnosis includes pancreatitis, cholecystitis, cholelithiasis, biliary colic, choledocholithiasis, appendicitis, GERD.  Labs:  I ordered, and personally interpreted labs.  The pertinent results include:  Lipase unremarkable CBC unremarkable CMP overall unremarkable  Urinalysis unremarkable  Imaging: I ordered imaging studies including right upper quadrant ultrasound I independently visualized and interpreted imaging which showed  Cholelithiasis with mild gallbladder wall thickening. No sonographic  Murphy sign. Findings are equivocal for acute cholecystitis.   I agree with the radiologist interpretation  Medications:  I ordered medication including IV fluids, Zofran, Pepcid, GI cocktail for symptom management.  Reevaluation of the patient after these medicines and interventions, I reevaluated the patient and found that they have improved I have reviewed the patients home medicines and have  made adjustments as needed   Consultations: I requested consultation with the general surgeon, Dr. Andrey Campanile, and discussed lab and imaging findings as well as pertinent plan - they recommend: Will admit the patient to the surgery service at [facility] for cholecystectomy   Disposition: Presenting suspicious for cholelithiasis with acute cholecystitis.  Doubt concerns at this time for biliary colic, choledocholithiasis, appendicitis, GERD. After consideration of the diagnostic results and the patients response to treatment, I feel that the patient would benefit from Admission to the hospital.  Discussed with patient plans for admission for cholecystectomy.  Patient agreeable this time.  Patient for safe for admission at this time.   This chart was dictated using voice recognition software, Dragon. Despite the best efforts of this provider to proofread and correct errors, errors may still occur which can change  documentation meaning.   Final Clinical Impression(s) / ED Diagnoses Final diagnoses:  Cholecystitis    Rx / DC Orders ED Discharge Orders     None         Costantino Kohlbeck A, PA-C 07/25/22 1817    Melene Plan, DO 07/26/22 [phone]

## 2022-07-25 NOTE — ED Triage Notes (Signed)
Pt states acid/burning sensation in stomach that started yesterday N/V with eating and drinking

## 2022-07-25 NOTE — ED Provider Notes (Signed)
Procedure note: Ultrasound Guided Peripheral IV Ultrasound guided peripheral 1.88 inch angiocath IV placement performed by me. Indications: Nursing unable to place IV. Details: The antecubital fossa and upper arm were evaluated with a multifrequency linear probe. Patent brachial veins were noted. 1 attempt was made to cannulate a vein under realtime US guidance with successful cannulation of the vein and catheter placement. There is return of non-pulsatile dark red blood. The patient tolerated the procedure well without complications. Images archived electronically.  CPT codes: 76937 and 36410    Yvonda Fouty, DO 07/25/22 1438  

## 2022-07-25 NOTE — H&P (Addendum)
Melanie Burch is an 27 y.o. female.   Chief Complaint: ab pain HPI: 42 yof who is otherwise healthy now presents with ruq pain for about 24 hours.  Has had some vague symptoms before she thought were reflux/heartburn. Has had ruq pain after eating dinner yesterday that has persisted. Some n/v.  Nothing helping at home.  She presented to outside er and was noted to have mild gbw thickening, 2 mm cbd, cholelithiasis.  Pain persisted and she was transferred here.  On evaluation she still has some pain in ruq.   Of note in 2022 she had presented two weeks after a c-section with ARDS thought to be related to preeclampsia and pulmonary edema.  Recovered after 5 days on vent, abx and is now without deficit  Past Medical History:  Diagnosis Date   GBS bacteriuria 12/05/2020   Pre-eclampsia, mild to moderate, third trimester     Past Surgical History:  Procedure Laterality Date   CESAREAN SECTION      Family History  Problem Relation Age of Onset   Pneumonia Paternal Grandfather    Social History:  reports that she has never smoked. She has never used smokeless tobacco. She reports current alcohol use. She reports that she does not use drugs.  Allergies: No Known Allergies  Medications Prior to Admission  Medication Sig Dispense Refill   valACYclovir (VALTREX) 1000 MG tablet Take 1 tablet (1,000 mg total) by mouth 2 (two) times daily. 10 tablet 0    Results for orders placed or performed during the hospital encounter of 07/25/22 (from the past 48 hour(s))  CBC with Differential     Status: None   Collection Time: 07/25/22  1:41 PM  Result Value Ref Range   WBC 10.4 4.0 - 10.5 K/uL   RBC 4.74 3.87 - 5.11 MIL/uL   Hemoglobin 13.0 12.0 - 15.0 g/dL   HCT 16.1 09.6 - 04.5 %   MCV 85.4 80.0 - 100.0 fL   MCH 27.4 26.0 - 34.0 pg   MCHC 32.1 30.0 - 36.0 g/dL   RDW 40.9 81.1 - 91.4 %   Platelets 389 150 - 400 K/uL   nRBC 0.0 0.0 - 0.2 %   Neutrophils Relative % 66 %   Neutro Abs 6.8  1.7 - 7.7 K/uL   Lymphocytes Relative 27 %   Lymphs Abs 2.8 0.7 - 4.0 K/uL   Monocytes Relative 7 %   Monocytes Absolute 0.7 0.1 - 1.0 K/uL   Eosinophils Relative 0 %   Eosinophils Absolute 0.0 0.0 - 0.5 K/uL   Basophils Relative 0 %   Basophils Absolute 0.0 0.0 - 0.1 K/uL   Immature Granulocytes 0 %   Abs Immature Granulocytes 0.03 0.00 - 0.07 K/uL    Comment: Performed at [facility], [address] [place], [place] [address]  Comprehensive metabolic panel     Status: Abnormal   Collection Time: 07/25/22  1:41 PM  Result Value Ref Range   Sodium 133 (L) 135 - 145 mmol/L   Potassium 3.5 3.5 - 5.1 mmol/L   Chloride 101 98 - 111 mmol/L   CO2 24 22 - 32 mmol/L   Glucose, Bld 94 70 - 99 mg/dL    Comment: Glucose reference range applies only to samples taken after fasting for at least 8 hours.   BUN 6 6 - 20 mg/dL   Creatinine, Ser 6.21 0.44 - 1.00 mg/dL   Calcium 8.9 8.9 - 30.8 mg/dL   Total Protein  8.7 (H) 6.5 - 8.1 g/dL   Albumin 3.7 3.5 - 5.0 g/dL   AST 13 (L) 15 - 41 U/L   ALT 12 0 - 44 U/L   Alkaline Phosphatase 107 38 - 126 U/L   Total Bilirubin 0.4 0.3 - 1.2 mg/dL   GFR, Estimated >16 >10 mL/min    Comment: (NOTE) Calculated using the CKD-EPI Creatinine Equation (2021)    Anion gap 8 5 - 15    Comment: Performed at [facility], [address] [place], [place] [address]  Lipase, blood     Status: None   Collection Time: 07/25/22  1:41 PM  Result Value Ref Range   Lipase 27 11 - 51 U/L    Comment: Performed at [facility], [address] [place], [place] [address]  Urinalysis, Routine w reflex microscopic -Urine, Clean Catch     Status: Abnormal   Collection Time: 07/25/22  1:52 PM  Result Value Ref Range   Color, Urine YELLOW YELLOW   APPearance CLEAR CLEAR   Specific Gravity, Urine 1.010 1.005 - 1.030   pH 6.5 5.0 - 8.0   Glucose, UA NEGATIVE NEGATIVE mg/dL   Hgb urine dipstick NEGATIVE NEGATIVE   Bilirubin Urine NEGATIVE  NEGATIVE   Ketones, ur NEGATIVE NEGATIVE mg/dL   Protein, ur NEGATIVE NEGATIVE mg/dL   Nitrite NEGATIVE NEGATIVE   Leukocytes,Ua TRACE (A) NEGATIVE    Comment: Performed at [facility], [address] [place], [place] [address]  Pregnancy, urine     Status: None   Collection Time: 07/25/22  1:52 PM  Result Value Ref Range   Preg Test, Ur NEGATIVE NEGATIVE    Comment:        THE SENSITIVITY OF THIS METHODOLOGY IS >20 mIU/mL. Performed at [facility], [address] [place], [place] [address]   Urinalysis, Microscopic (reflex)     Status: Abnormal   Collection Time: 07/25/22  1:52 PM  Result Value Ref Range   RBC / HPF 0-5 0 - 5 RBC/hpf   WBC, UA 0-5 0 - 5 WBC/hpf   Bacteria, UA FEW (A) NONE SEEN   Squamous Epithelial / HPF 0-5 0 - 5 /HPF    Comment: Performed at [facility], [address] [place], [place] [address]   US Abdomen Limited RUQ (LIVER/GB)  Result Date: 07/25/2022 CLINICAL DATA:  Burning pain in the stomach associated with nausea and vomiting EXAM: ULTRASOUND ABDOMEN LIMITED RIGHT UPPER QUADRANT COMPARISON:  None Available. FINDINGS: Gallbladder: Cholelithiasis. Mild gallbladder wall thickening. No sonographic Murphy sign noted by sonographer. Common bile duct: Diameter: 2 mm Liver: No focal lesion identified. Within normal limits in parenchymal echogenicity. Portal vein is patent on color Doppler imaging with normal direction of blood flow towards the liver. Other: None. IMPRESSION: Cholelithiasis with mild gallbladder wall thickening. No sonographic Murphy sign. Findings are equivocal for acute cholecystitis. Electronically Signed   By: Agustin Cree M.D.   On: 07/25/2022 15:58    Review of Systems  Constitutional:  Negative for fever.  Gastrointestinal:  Positive for abdominal pain, nausea and vomiting. Negative for diarrhea.  All other systems reviewed and are negative.   Blood pressure (!) 154/96, pulse 60, temperature 98.6 F (37  C), temperature source Oral, resp. rate 18, height 5\' 4"  (1.626 m), weight 113.4 kg, last menstrual period 07/22/2022, SpO2 100 %. Physical Exam Vitals reviewed.  Constitutional:      Appearance:  She is well-developed.  Eyes:     General: No scleral icterus. Cardiovascular:     Rate and Rhythm: Normal rate.  Pulmonary:     Effort: Pulmonary effort is normal.  Abdominal:     General: A surgical scar is present. Bowel sounds are normal.     Palpations: Abdomen is soft.     Tenderness: There is abdominal tenderness (mild) in the right upper quadrant. Negative signs include Murphy's sign.     Hernia: No hernia is present.     Comments: Scar luq is from abscess  Skin:    General: Skin is warm and dry.     Capillary Refill: Capillary refill takes less than 2 seconds.  Neurological:     General: No focal deficit present.     Mental Status: She is alert.      Assessment/Plan Symptomatic cholelithiasis, ? Early cholecystitis -persistent pain so needs admission with plan for lap chole tomorrow -abx, npo after mn  [place] reviewed and agree with cholelithiasis, cbc and cmet reviewed, discussed with er provider upon transfer.  Emelia Loron, MD 07/25/2022, 7:30 PM

## 2022-07-25 NOTE — ED Notes (Signed)
[facility] at bedside 

## 2022-07-25 NOTE — ED Notes (Signed)
Pt ambulatory to bathroom, no assistance needed.  

## 2022-07-26 ENCOUNTER — Observation Stay (HOSPITAL_COMMUNITY): Payer: Managed Care, Other (non HMO) | Admitting: Anesthesiology

## 2022-07-26 ENCOUNTER — Encounter (HOSPITAL_COMMUNITY)
Admission: EM | Disposition: A | Discharge status: Home / Self Care | Payer: Self-pay | Source: Home / Self Care | Attending: Emergency Medicine

## 2022-07-26 ENCOUNTER — Observation Stay (HOSPITAL_BASED_OUTPATIENT_CLINIC_OR_DEPARTMENT_OTHER): Payer: Managed Care, Other (non HMO) | Admitting: Anesthesiology

## 2022-07-26 ENCOUNTER — Encounter (HOSPITAL_COMMUNITY): Payer: Self-pay

## 2022-07-26 ENCOUNTER — Other Ambulatory Visit: Payer: Self-pay

## 2022-07-26 DIAGNOSIS — E669 Obesity, unspecified: Secondary | ICD-10-CM | POA: Diagnosis not present

## 2022-07-26 DIAGNOSIS — I340 Nonrheumatic mitral (valve) insufficiency: Secondary | ICD-10-CM

## 2022-07-26 DIAGNOSIS — Z6841 Body Mass Index (BMI) 40.0 and over, adult: Secondary | ICD-10-CM

## 2022-07-26 DIAGNOSIS — K8000 Calculus of gallbladder with acute cholecystitis without obstruction: Secondary | ICD-10-CM

## 2022-07-26 HISTORY — PX: CHOLECYSTECTOMY: SHX55

## 2022-07-26 LAB — COMPREHENSIVE METABOLIC PANEL
ALT: 11 U/L (ref 0–44)
AST: 10 U/L — ABNORMAL LOW (ref 15–41)
Albumin: 2.9 g/dL — ABNORMAL LOW (ref 3.5–5.0)
Alkaline Phosphatase: 81 U/L (ref 38–126)
Anion gap: 7 (ref 5–15)
BUN: <5 mg/dL — ABNORMAL LOW (ref 6–20)
CO2: 22 mmol/L (ref 22–32)
Calcium: 7.9 mg/dL — ABNORMAL LOW (ref 8.9–10.3)
Chloride: 108 mmol/L (ref 98–111)
Creatinine, Ser: 0.62 mg/dL (ref 0.44–1.00)
GFR, Estimated: >60 mL/min (ref >60)
Glucose, Bld: 92 mg/dL (ref 70–99)
Potassium: 4.2 mmol/L (ref 3.5–5.1)
Sodium: 137 mmol/L (ref 135–145)
Total Bilirubin: 0.4 mg/dL (ref 0.3–1.2)
Total Protein: 7.1 g/dL (ref 6.5–8.1)

## 2022-07-26 LAB — CBC
HCT: 37 % (ref 36.0–46.0)
Hemoglobin: 11.6 g/dL — ABNORMAL LOW (ref 12.0–15.0)
MCH: 28 pg (ref 26.0–34.0)
MCHC: 31.4 g/dL (ref 30.0–36.0)
MCV: 89.2 fL (ref 80.0–100.0)
Platelets: 334 10*3/uL (ref 150–400)
RBC: 4.15 MIL/uL (ref 3.87–5.11)
RDW: 13.5 % (ref 11.5–15.5)
WBC: 8.3 10*3/uL (ref 4.0–10.5)
nRBC: 0 % (ref 0.0–0.2)

## 2022-07-26 LAB — HIV ANTIBODY (ROUTINE TESTING W REFLEX): HIV Screen 4th Generation wRfx: NONREACTIVE

## 2022-07-26 SURGERY — LAPAROSCOPIC CHOLECYSTECTOMY
Anesthesia: General

## 2022-07-26 MED ORDER — DEXAMETHASONE SODIUM PHOSPHATE 10 MG/ML IJ SOLN
INTRAMUSCULAR | Status: DC | PRN
  Administered 2022-07-26: 10 mg via INTRAVENOUS

## 2022-07-26 MED ORDER — 0.9 % SODIUM CHLORIDE (POUR BTL) OPTIME
TOPICAL | Status: DC | PRN
  Administered 2022-07-26: 1000 mL

## 2022-07-26 MED ORDER — ROCURONIUM BROMIDE 100 MG/10ML IV SOLN
INTRAVENOUS | Status: DC | PRN
  Administered 2022-07-26: 60 mg via INTRAVENOUS

## 2022-07-26 MED ORDER — LIDOCAINE HCL (PF) 2 % IJ SOLN
INTRAMUSCULAR | Status: AC
  Filled 2022-07-26: qty 5

## 2022-07-26 MED ORDER — HYDROMORPHONE HCL 1 MG/ML IJ SOLN: 0.2500 mg | INTRAMUSCULAR | Status: DC | PRN

## 2022-07-26 MED ORDER — PROPOFOL 10 MG/ML IV BOLUS
INTRAVENOUS | Status: AC
  Filled 2022-07-26: qty 20

## 2022-07-26 MED ORDER — LACTATED RINGERS IR SOLN
Status: DC | PRN
  Administered 2022-07-26: 1000 mL

## 2022-07-26 MED ORDER — BUPIVACAINE-EPINEPHRINE 0.25% -1:200000 IJ SOLN
INTRAMUSCULAR | Status: DC | PRN
  Administered 2022-07-26: 50 mL

## 2022-07-26 MED ORDER — LIDOCAINE HCL (CARDIAC) PF 100 MG/5ML IV SOSY
PREFILLED_SYRINGE | INTRAVENOUS | Status: DC | PRN
  Administered 2022-07-26: 50 mg via INTRAVENOUS

## 2022-07-26 MED ORDER — CHLORHEXIDINE GLUCONATE 0.12 % MT SOLN
15.0000 mL | Freq: Once | OROMUCOSAL | Status: AC
  Administered 2022-07-26: 15 mL via OROMUCOSAL

## 2022-07-26 MED ORDER — ROCURONIUM BROMIDE 10 MG/ML (PF) SYRINGE
PREFILLED_SYRINGE | INTRAVENOUS | Status: AC
  Filled 2022-07-26: qty 10

## 2022-07-26 MED ORDER — OXYCODONE HCL 5 MG PO TABS: 5.0000 mg | ORAL_TABLET | Freq: Once | ORAL | Status: AC | PRN

## 2022-07-26 MED ORDER — PROPOFOL 10 MG/ML IV BOLUS
INTRAVENOUS | Status: DC | PRN
  Administered 2022-07-26: 200 mg via INTRAVENOUS

## 2022-07-26 MED ORDER — PHENYLEPHRINE 80 MCG/ML (10ML) SYRINGE FOR IV PUSH (FOR BLOOD PRESSURE SUPPORT)
PREFILLED_SYRINGE | INTRAVENOUS | Status: AC
  Filled 2022-07-26: qty 10

## 2022-07-26 MED ORDER — FENTANYL CITRATE (PF) 100 MCG/2ML IJ SOLN
INTRAMUSCULAR | Status: AC
  Filled 2022-07-26: qty 2

## 2022-07-26 MED ORDER — ENOXAPARIN SODIUM 40 MG/0.4ML IJ SOSY
40.0000 mg | PREFILLED_SYRINGE | INTRAMUSCULAR | Status: DC
  Administered 2022-07-27: 40 mg via SUBCUTANEOUS
  Filled 2022-07-26: qty 0.4

## 2022-07-26 MED ORDER — PROMETHAZINE HCL 25 MG/ML IJ SOLN: 6.2500 mg | INTRAMUSCULAR | Status: DC | PRN

## 2022-07-26 MED ORDER — MIDAZOLAM HCL 5 MG/5ML IJ SOLN
INTRAMUSCULAR | Status: DC | PRN
  Administered 2022-07-26: 2 mg via INTRAVENOUS

## 2022-07-26 MED ORDER — DEXMEDETOMIDINE HCL IN NACL 80 MCG/20ML IV SOLN
INTRAVENOUS | Status: DC | PRN
  Administered 2022-07-26 (×2): 8 ug via INTRAVENOUS

## 2022-07-26 MED ORDER — MIDAZOLAM HCL 2 MG/2ML IJ SOLN
INTRAMUSCULAR | Status: AC
  Filled 2022-07-26: qty 2

## 2022-07-26 MED ORDER — OXYCODONE HCL 5 MG PO TABS
ORAL_TABLET | ORAL | Status: AC
  Administered 2022-07-26: 5 mg via ORAL
  Filled 2022-07-26: qty 1

## 2022-07-26 MED ORDER — FENTANYL CITRATE (PF) 100 MCG/2ML IJ SOLN
INTRAMUSCULAR | Status: DC | PRN
  Administered 2022-07-26 (×3): 100 ug via INTRAVENOUS

## 2022-07-26 MED ORDER — ACETAMINOPHEN 500 MG PO TABS
1000.0000 mg | ORAL_TABLET | Freq: Once | ORAL | Status: AC
  Administered 2022-07-26: 1000 mg via ORAL

## 2022-07-26 MED ORDER — KETOROLAC TROMETHAMINE 30 MG/ML IJ SOLN
INTRAMUSCULAR | Status: DC | PRN
  Administered 2022-07-26: 30 mg via INTRAVENOUS

## 2022-07-26 MED ORDER — SUGAMMADEX SODIUM 200 MG/2ML IV SOLN
INTRAVENOUS | Status: DC | PRN
  Administered 2022-07-26: 200 mg via INTRAVENOUS

## 2022-07-26 MED ORDER — KETOROLAC TROMETHAMINE 30 MG/ML IJ SOLN
INTRAMUSCULAR | Status: AC
  Filled 2022-07-26: qty 1

## 2022-07-26 MED ORDER — INDOCYANINE GREEN 25 MG IV SOLR
2.5000 mg | INTRAVENOUS | Status: AC
  Administered 2022-07-26: 2.5 mg via INTRAVENOUS

## 2022-07-26 MED ORDER — OXYCODONE HCL 5 MG/5ML PO SOLN: 5.0000 mg | Freq: Once | ORAL | Status: AC | PRN

## 2022-07-26 MED ORDER — ONDANSETRON HCL 4 MG/2ML IJ SOLN
INTRAMUSCULAR | Status: DC | PRN
  Administered 2022-07-26: 4 mg via INTRAVENOUS

## 2022-07-26 MED ORDER — ALBUMIN HUMAN 5 % IV SOLN
INTRAVENOUS | Status: AC
  Filled 2022-07-26: qty 250

## 2022-07-26 MED ORDER — SUCCINYLCHOLINE CHLORIDE 200 MG/10ML IV SOSY
PREFILLED_SYRINGE | INTRAVENOUS | Status: DC | PRN
  Administered 2022-07-26: 140 mg via INTRAVENOUS

## 2022-07-26 MED ORDER — MIDAZOLAM HCL 2 MG/2ML IJ SOLN: 0.5000 mg | Freq: Once | INTRAMUSCULAR | Status: DC | PRN

## 2022-07-26 MED ORDER — LACTATED RINGERS IV SOLN: INTRAVENOUS | Status: DC

## 2022-07-26 MED ORDER — MEPERIDINE HCL 50 MG/ML IJ SOLN: 6.2500 mg | INTRAMUSCULAR | Status: DC | PRN

## 2022-07-26 MED ORDER — BUPIVACAINE HCL 0.25 % IJ SOLN
INTRAMUSCULAR | Status: AC
  Filled 2022-07-26: qty 1

## 2022-07-26 SURGICAL SUPPLY — 55 items
ADH SKN CLS APL DERMABOND .7 (GAUZE/BANDAGES/DRESSINGS) ×1
APL PRP STRL LF DISP 70% ISPRP (MISCELLANEOUS) ×1
APL SRG 38 LTWT LNG FL B (MISCELLANEOUS)
APPLICATOR ARISTA FLEXITIP XL (MISCELLANEOUS) IMPLANT
APPLIER CLIP 5 13 M/L LIGAMAX5 (MISCELLANEOUS) ×1
APPLIER CLIP ROT 10 11.4 M/L (STAPLE)
APR CLP MED LRG 11.4X10 (STAPLE)
APR CLP MED LRG 5 ANG JAW (MISCELLANEOUS) ×1
BAG COUNTER SPONGE SURGICOUNT (BAG) IMPLANT
BAG SPEC RTRVL 10 TROC 200 (ENDOMECHANICALS) ×1
BAG SPNG CNTER NS LX DISP (BAG)
CABLE HIGH FREQUENCY MONO STRZ (ELECTRODE) ×1 IMPLANT
CHLORAPREP W/TINT 26 (MISCELLANEOUS) ×1 IMPLANT
CLIP APPLIE 5 13 M/L LIGAMAX5 (MISCELLANEOUS) IMPLANT
CLIP APPLIE ROT 10 11.4 M/L (STAPLE) IMPLANT
CLIP LIGATING HEMO O LOK GREEN (MISCELLANEOUS) IMPLANT
COVER MAYO STAND XLG (MISCELLANEOUS) IMPLANT
COVER SURGICAL LIGHT HANDLE (MISCELLANEOUS) ×1 IMPLANT
DERMABOND ADVANCED .7 DNX12 (GAUZE/BANDAGES/DRESSINGS) IMPLANT
DRAPE C-ARM 42X120 X-RAY (DRAPES) IMPLANT
DRSG TEGADERM 2-3/8X2-3/4 SM (GAUZE/BANDAGES/DRESSINGS) ×3 IMPLANT
DRSG TEGADERM 4X4.75 (GAUZE/BANDAGES/DRESSINGS) ×1 IMPLANT
ELECT REM PT RETURN 15FT ADLT (MISCELLANEOUS) ×1 IMPLANT
GAUZE SPONGE 2X2 8PLY STRL LF (GAUZE/BANDAGES/DRESSINGS) ×1 IMPLANT
GLOVE BIO SURGEON STRL SZ7.5 (GLOVE) ×1 IMPLANT
GLOVE INDICATOR 8.0 STRL GRN (GLOVE) ×1 IMPLANT
GOWN STRL REUS W/ TWL XL LVL3 (GOWN DISPOSABLE) ×1 IMPLANT
GOWN STRL REUS W/TWL XL LVL3 (GOWN DISPOSABLE) ×1
GRASPER SUT TROCAR 14GX15 (MISCELLANEOUS) IMPLANT
HEMOSTAT ARISTA ABSORB 3G PWDR (HEMOSTASIS) IMPLANT
HEMOSTAT SNOW SURGICEL 2X4 (HEMOSTASIS) IMPLANT
IRRIG SUCT STRYKERFLOW 2 WTIP (MISCELLANEOUS) ×1
IRRIGATION SUCT STRKRFLW 2 WTP (MISCELLANEOUS) ×1 IMPLANT
KIT BASIN OR (CUSTOM PROCEDURE TRAY) ×1 IMPLANT
KIT TURNOVER KIT A (KITS) IMPLANT
L-HOOK LAP DISP 36CM (ELECTROSURGICAL)
LHOOK LAP DISP 36CM (ELECTROSURGICAL) IMPLANT
POUCH RETRIEVAL ECOSAC 10 (ENDOMECHANICALS) ×1 IMPLANT
SCISSORS LAP 5X35 DISP (ENDOMECHANICALS) ×1 IMPLANT
SET CHOLANGIOGRAPH MIX (MISCELLANEOUS) IMPLANT
SET TUBE SMOKE EVAC HIGH FLOW (TUBING) ×1 IMPLANT
SLEEVE Z-THREAD 5X100MM (TROCAR) ×2 IMPLANT
SPIKE FLUID TRANSFER (MISCELLANEOUS) ×1 IMPLANT
STRIP CLOSURE SKIN 1/2X4 (GAUZE/BANDAGES/DRESSINGS) ×1 IMPLANT
SUT MNCRL AB 4-0 PS2 18 (SUTURE) ×1 IMPLANT
SUT VIC AB 0 UR5 27 (SUTURE) IMPLANT
SUT VICRYL 0 TIES 12 18 (SUTURE) IMPLANT
SUT VICRYL 0 UR6 27IN ABS (SUTURE) IMPLANT
TOWEL OR 17X26 10 PK STRL BLUE (TOWEL DISPOSABLE) ×1 IMPLANT
TOWEL OR NON WOVEN STRL DISP B (DISPOSABLE) ×1 IMPLANT
TRAY LAPAROSCOPIC (CUSTOM PROCEDURE TRAY) ×1 IMPLANT
TROCAR 11X100 Z THREAD (TROCAR) IMPLANT
TROCAR BALLN 12MMX100 BLUNT (TROCAR) ×1 IMPLANT
TROCAR XCEL NON-BLD 5MMX100MML (ENDOMECHANICALS) IMPLANT
TROCAR Z-THREAD OPTICAL 5X100M (TROCAR) ×1 IMPLANT

## 2022-07-26 NOTE — Anesthesia Preprocedure Evaluation (Addendum)
Anesthesia Evaluation  Patient identified by MRN, date of birth, ID band Patient awake    Reviewed: Allergy & Precautions, NPO status , Patient's Chart, lab work & pertinent test results  History of Anesthesia Complications Negative for: history of anesthetic complications  Airway Mallampati: II  TM Distance: >3 FB Neck ROM: Full    Dental  (+) Dental Advisory Given   Pulmonary  H/o ARDS s/p C-section for preg induced HTN   breath sounds clear to auscultation       Cardiovascular negative cardio ROS  Rhythm:Regular Rate:Normal  '22 ECHO:  1. LV EF 60-65%. The LV has normal function, no regional wall motion abnormalities. Left ventricular diastolic parameters were normal.   2. RVF is normal. The right ventricular size is normal. There is mildly elevated pulmonary artery systolic pressure.   3. The mitral valve is normal in structure. Mild MR. No evidence of MS.   4. The tricuspid valve is abnormal.   5. The aortic valve is tricuspid. No AI. No AS.     Neuro/Psych negative neurological ROS     GI/Hepatic Neg liver ROS,,,N/V for acute chole   Endo/Other  BMI 43  Renal/GU negative Renal ROS     Musculoskeletal   Abdominal  (+) + obese  Peds  Hematology negative hematology ROS (+)   Anesthesia Other Findings   Reproductive/Obstetrics                             Anesthesia Physical Anesthesia Plan  ASA: 2  Anesthesia Plan: General   Post-op Pain Management: Tylenol PO (pre-op)*   Induction: Intravenous and Rapid sequence  PONV Risk Score and Plan: 3 and Ondansetron and Dexamethasone  Airway Management Planned: Oral ETT  Additional Equipment: None  Intra-op Plan:   Post-operative Plan: Extubation in OR  Informed Consent: I have reviewed the patients History and Physical, chart, labs and discussed the procedure including the risks, benefits and alternatives for the proposed  anesthesia with the patient or authorized representative who has indicated his/her understanding and acceptance.     Dental advisory given  Plan Discussed with: CRNA and Surgeon  Anesthesia Plan Comments:         Anesthesia Quick Evaluation

## 2022-07-26 NOTE — TOC CM/SW Note (Signed)
Transition of Care (TOC) Screening Note  Patient Details  Name: Melanie Burch Date of Birth: [birth year]  Transition of Care [facility]) CM/SW Contact:    Ewing Schlein, LCSW Phone Number: 07/26/2022, 11:47 AM  Transition of Care Department [facility]) has reviewed patient and no TOC needs have been identified at this time. We will continue to monitor patient advancement through interdisciplinary progression rounds. If new patient transition needs arise, please place a TOC consult.

## 2022-07-26 NOTE — Transfer of Care (Signed)
Immediate Anesthesia Transfer of Care Note  Patient: Melanie Burch  Procedure(s) Performed: LAPAROSCOPIC CHOLECYSTECTOMY WITH ICG DYE  Patient Location: PACU  Anesthesia Type:General  Level of Consciousness: awake, alert , and oriented  Airway & Oxygen Therapy: Patient Spontanous Breathing and Patient connected to face mask oxygen  Post-op Assessment: Report given to RN and Post -op Vital signs reviewed and stable  Post vital signs: Reviewed and stable  Last Vitals:  Vitals Value Taken Time  BP 135/99 07/26/22 1240  Temp    Pulse 78 07/26/22 1242  Resp 18 07/26/22 1242  SpO2 100 % 07/26/22 1242  Vitals shown include unvalidated device data.  Last Pain:  Vitals:   07/26/22 0824  TempSrc:   PainSc: 0-No pain      Patients Stated Pain Goal: 2 (07/25/22 1854)  Complications: No notable events documented.

## 2022-07-26 NOTE — Anesthesia Procedure Notes (Signed)
Procedure Name: Intubation Date/Time: 07/26/2022 10:58 AM  Performed by: [place], Clydene Pugh, CRNAPre-anesthesia Checklist: Patient identified, Emergency Drugs available, Suction available and Patient being monitored Patient Re-evaluated:Patient Re-evaluated prior to induction Oxygen Delivery Method: Circle system utilized Preoxygenation: Pre-oxygenation with 100% oxygen Induction Type: IV induction, Rapid sequence and Cricoid Pressure applied Laryngoscope Size: Mac and 3 Grade View: Grade I Tube type: Oral Tube size: 7.0 mm Number of attempts: 1 Airway Equipment and Method: Stylet and Oral airway Placement Confirmation: ETT inserted through vocal cords under direct vision, positive ETCO2 and breath sounds checked- equal and bilateral Secured at: 21 cm Tube secured with: Tape Dental Injury: Teeth and Oropharynx as per pre-operative assessment

## 2022-07-26 NOTE — Anesthesia Postprocedure Evaluation (Signed)
Anesthesia Post Note  Patient: Melanie Burch  Procedure(s) Performed: LAPAROSCOPIC CHOLECYSTECTOMY WITH ICG DYE     Patient location during evaluation: PACU Anesthesia Type: General Level of consciousness: awake and alert Pain management: pain level controlled Vital Signs Assessment: post-procedure vital signs reviewed and stable Respiratory status: spontaneous breathing, nonlabored ventilation, respiratory function stable and patient connected to nasal cannula oxygen Cardiovascular status: blood pressure returned to baseline and stable Postop Assessment: no apparent nausea or vomiting Anesthetic complications: no   No notable events documented.  Last Vitals:  Vitals:   07/26/22 1315 07/26/22 1342  BP: (!) 141/99 (!) 144/91  Pulse: (!) 58 (!) 54  Resp: 15 15  Temp:  36.8 C  SpO2: 98% 100%    Last Pain:  Vitals:   07/26/22 1342  TempSrc: Oral  PainSc:                  Izek Corvino

## 2022-07-26 NOTE — Interval H&P Note (Signed)
History and Physical Interval Note:  07/26/2022 10:39 AM  Melanie Burch  has presented today for surgery, with the diagnosis of cholecystitis.  The various methods of treatment have been discussed with the patient and family. After consideration of risks, benefits and other options for treatment, the patient has consented to  Procedure(s): LAPAROSCOPIC CHOLECYSTECTOMY WITH ICG DYE (N/A) as a surgical intervention.  The patient's history has been reviewed, patient examined, no change in status, stable for surgery.  I have reviewed the patient's chart and labs.  Questions were answered to the patient's satisfaction.     I believe the patient's symptoms are consistent with gallbladder disease.  We discussed gallbladder disease. We discussed the signs & symptoms of acute cholecystitis  I discussed laparoscopic cholecystectomy with icg dye and possible IOC in detail.  The patient was shown diagrams detailing the procedure.  We discussed the risks and benefits of a laparoscopic cholecystectomy including, but not limited to bleeding, infection, injury to surrounding structures such as the intestine or liver, bile leak, retained gallstones, need to convert to an open procedure, prolonged diarrhea, blood clots such as  DVT, common bile duct injury, anesthesia risks, and possible need for additional procedures.  We discussed the typical post-operative recovery course. I explained that the likelihood of improvement of their symptoms is good.  Gaynelle Adu

## 2022-07-26 NOTE — Op Note (Signed)
Melanie Burch [id number] [birth year] 07/26/2022  Laparoscopic Cholecystectomy with near infrared fluorescent cholangiography procedure Note  Indications: This patient presents with symptomatic gallbladder disease and will undergo laparoscopic cholecystectomy.  Pre-operative Diagnosis: Calculus of gallbladder with acute cholecystitis, without mention of obstruction  Post-operative Diagnosis: Same  Surgeon: Gaynelle Adu MD FACS  Assistants: none  Anesthesia: General endotracheal anesthesia  Procedure Details  The patient was seen again in the Holding Room. The risks, benefits, complications, treatment options, and expected outcomes were discussed with the patient. The possibilities of reaction to medication, pulmonary aspiration, perforation of viscus, bleeding, recurrent infection, finding a normal gallbladder, the need for additional procedures, failure to diagnose a condition, the possible need to convert to an open procedure, and creating a complication requiring transfusion or operation were discussed with the patient. The likelihood of improving the patient's symptoms with return to their baseline status is good.  The patient and/or family concurred with the proposed plan, giving informed consent. The site of surgery properly noted. The patient was taken to Operating Room, identified as Melanie Burch and the procedure verified as Laparoscopic Cholecystectomy with ICG dye.  A Time Out was held and the above information confirmed. Antibiotic prophylaxis was administered.    ICG dye was administered preoperatively.    General endotracheal anesthesia was then administered and tolerated well. After the induction, the abdomen was prepped with Chloraprep and draped in the sterile fashion. The patient was positioned in the supine position.  Given the patient's central truncal obesity I elected to gain access to the abdomen using the Optiview technique in the left upper quadrant.  A small  incision was made to the left of the midline below the left subcostal margin.  Then using a 0 degree 5 mm laparoscope through a 5 mm trocar I advanced it through all layers of the abdominal wall and carefully entered the abdominal cavity.  Pneumoperitoneum was established to a patient pressure of 15 mmHg without any change in patient vital signs.  The laparoscope was advanced and the abdominal cavity was surveilled.  There is no evidence of injury to surrounding viscera.  Patient was placed in reverse Trendelenburg and rotated slightly to the left.  Patient had some omentum stuck to the lower midline below the level of the umbilicus unsure if that was an actual hernia in that location or just adhesions from a prior C-section.  She did have a small fascial defect at her umbilicus.  This was avoided.  A small incision was made in the supraumbilical position and a 5 mm trocar was placed in that location  Two 5-mm ports were placed in the right upper quadrant. All skin incisions were infiltrated with a local anesthetic agent before making the incision and placing the trocars.     The gallbladder was identified.  The gallbladder was thickened, edematous and distended.  The gallbladder was aspirated in order to facilitate retraction., the fundus grasped and retracted cephalad. Adhesions were lysed bluntly and with the electrocautery where indicated, taking care not to injure any adjacent organs or viscus. The infundibulum was grasped and retracted laterally, exposing the peritoneum overlying the triangle of Calot. This was then divided and exposed in a blunt fashion.  This tissue in this location was friable and fatty but I was able to ultimately achieve a large critical view.  A critical view of the cystic duct and cystic artery was obtained.  The cystic duct was clearly identified and bluntly dissected circumferentially.  Utilizing the Stryker camera system near  infrared fluorescent activity was visualized in the  liver, cystic duct, common hepatic duct and common bile duct & and small intestine.  This served as a secondary confirmation of our anatomy.  The patient had a little bit of a dilated cystic duct probably due to inflammation as well as a small anterior cystic artery branch and a little bit larger posterior cystic artery branch going into the gallbladder.  There were no other structures going into the gallbladder.  The cystic duct was then ligated with clips and divided. The cystic artery (both a small anterior and a little bit larger posterior branch) which had been identified & dissected free was ligated with clips and divided as well.   The gallbladder was dissected from the liver bed in retrograde fashion with the electrocautery.  The liver bed was irrigated and inspected. Hemostasis was achieved with the electrocautery. Copious irrigation was utilized and was repeatedly aspirated until clear.  I exchanged the 5 mm trocar in the left upper quadrant for a 12 mm trocar.  The gallbladder was removed and placed in an Ecco sac.  The gallbladder and Ecco sac were then removed through the left upper quadrant port site.  The left upper quadrant port site fascial defect was closed with 3 interrupted 0 Vicryl's using PMI suture passer with laparoscopic guidance.  We again inspected the right upper quadrant for hemostasis.  The the left upper quadrant closure was inspected and there was no air leak and nothing trapped within the closure. Pneumoperitoneum was released as we removed the trocars.  4-0 Monocryl was used to close the skin.  Dermabond was applied. The patient was then extubated and brought to the recovery room in stable condition. Instrument, sponge, and needle counts were correct at closure and at the conclusion of the case.   Findings: Cholecystitis with Cholelithiasis Near infrared fluorescent cholangiography performed demonstrated fluorescent activity within the common hepatic duct, common bile  duct, small intestine, and cystic duct.  Estimated Blood Loss: Minimal         Drains: none         Specimens: Gallbladder           Complications: None; patient tolerated the procedure well.         Disposition: PACU - hemodynamically stable.         Condition: stable  Melanie Burch. Andrey Campanile, MD, FACS General, Bariatric, & Minimally Invasive Surgery [facility],  [facility]

## 2022-07-26 NOTE — Discharge Instructions (Signed)
[facility] ° °Please arrive at least 30 min before your appointment to complete your check in paperwork.  If you are unable to arrive 30 min prior to your appointment time we may have to cancel or reschedule you. °LAPAROSCOPIC SURGERY: POST OP INSTRUCTIONS °Always review your discharge instruction sheet given to you by the facility where your surgery was performed. °IF YOU HAVE DISABILITY OR FAMILY LEAVE FORMS, YOU MUST BRING THEM TO THE OFFICE FOR PROCESSING.   °DO NOT GIVE THEM TO YOUR DOCTOR. ° °PAIN CONTROL ° °First take acetaminophen (Tylenol) AND/or ibuprofen (Advil) to control your pain after surgery.  Follow directions on package.  Taking acetaminophen (Tylenol) and/or ibuprofen (Advil) regularly after surgery will help to control your pain and lower the amount of prescription pain medication you may need.  You should not take more than 4,000 mg (4 grams) of acetaminophen (Tylenol) in 24 hours.  You should not take ibuprofen (Advil), aleve, motrin, naprosyn or other NSAIDS if you have a history of stomach ulcers or chronic kidney disease.  °A prescription for pain medication may be given to you upon discharge.  Take your pain medication as prescribed, if you still have uncontrolled pain after taking acetaminophen (Tylenol) or ibuprofen (Advil). °Use ice packs to help control pain. °If you need a refill on your pain medication, please contact your pharmacy.  They will contact our office to request authorization. Prescriptions will not be filled after 5pm or on week-ends. ° °HOME MEDICATIONS °Take your usually prescribed medications unless otherwise directed. ° °DIET °You should follow a light diet the first few days after arrival home.  Be sure to include lots of fluids daily. Avoid fatty, fried foods.  ° °CONSTIPATION °It is common to experience some constipation after surgery and if you are taking pain medication.  Increasing fluid intake and taking a stool softener (such as Colace)  will usually help or prevent this problem from occurring.  A mild laxative (Milk of Magnesia or Miralax) should be taken according to package instructions if there are no bowel movements after 48 hours. ° °WOUND/INCISION CARE °Most patients will experience some swelling and bruising in the area of the incisions.  Ice packs will help.  Swelling and bruising can take several days to resolve.  °Unless discharge instructions indicate otherwise, follow guidelines below  °STERI-STRIPS - you may remove your outer bandages 48 hours after surgery, and you may shower at that time.  You have steri-strips (small skin tapes) in place directly over the incision.  These strips should be left on the skin for 7-10 days.   °DERMABOND/SKIN GLUE - you may shower in 24 hours.  The glue will flake off over the next 2-3 weeks. °Any sutures or staples will be removed at the office during your follow-up visit. ° °ACTIVITIES °You may resume regular (light) daily activities beginning the next day--such as daily self-care, walking, climbing stairs--gradually increasing activities as tolerated.  You may have sexual intercourse when it is comfortable.  Refrain from any heavy lifting or straining until approved by your doctor. °You may drive when you are no longer taking prescription pain medication, you can comfortably wear a seatbelt, and you can safely maneuver your car and apply brakes. ° °FOLLOW-UP °You should see your doctor in the office for a follow-up appointment approximately 2-3 weeks after your surgery.  You should have been given your post-op/follow-up appointment when your surgery was scheduled.  If you did not receive a post-op/follow-up appointment, make sure   that you call for this appointment within a day or two after you arrive home to insure a convenient appointment time. ° ° °WHEN TO CALL YOUR DOCTOR: °Fever over 101.0 °Inability to urinate °Continued bleeding from incision. °Increased pain, redness, or drainage from the  incision. °Increasing abdominal pain ° °The clinic staff is available to answer your questions during regular business hours.  Please don’t hesitate to call and ask to speak to one of the nurses for clinical concerns.  If you have a medical emergency, go to the nearest emergency room or call 911.  A surgeon from [facility] is always on call at the hospital. °[address], [address], [place], [place]  [address] ? [address] [address], [place], [place]   [address] °[phone] ? [phone] ? FAX [phone] ° ° ° ° °Managing Your Pain After Surgery Without Opioids ° ° ° °Thank you for participating in our program to help patients manage their pain after surgery without opioids. This is part of our effort to provide you with the best care possible, without exposing you or your family to the risk that opioids pose. ° °What pain can I expect after surgery? °You can expect to have some pain after surgery. This is normal. The pain is typically worse the day after surgery, and quickly begins to get better. °Many studies have found that many patients are able to manage their pain after surgery with Over-the-Counter (OTC) medications such as Tylenol and Motrin. If you have a condition that does not allow you to take Tylenol or Motrin, notify your surgical team. ° °How will I manage my pain? °The best strategy for controlling your pain after surgery is around the clock pain control with Tylenol (acetaminophen) and Motrin (ibuprofen or Advil). Alternating these medications with each other allows you to maximize your pain control. In addition to Tylenol and Motrin, you can use heating pads or ice packs on your incisions to help reduce your pain. ° °How will I alternate your regular strength over-the-counter pain medication? °You will take a dose of pain medication every three hours. °Start by taking 650 mg of Tylenol (2 pills of 325 mg) °3 hours later take 600 mg of Motrin (3 pills of 200 mg) °3 hours after  taking the Motrin take 650 mg of Tylenol °3 hours after that take 600 mg of Motrin. ° ° °- 1 - ° °See example - if your first dose of Tylenol is at 12:00 PM ° ° °12:00 PM Tylenol 650 mg (2 pills of 325 mg)  °3:00 PM Motrin 600 mg (3 pills of 200 mg)  °6:00 PM Tylenol 650 mg (2 pills of 325 mg)  °9:00 PM Motrin 600 mg (3 pills of 200 mg)  °Continue alternating every 3 hours  ° °We recommend that you follow this schedule around-the-clock for at least 3 days after surgery, or until you feel that it is no longer needed. Use the table on the last page of this handout to keep track of the medications you are taking. °Important: °Do not take more than 3000mg of Tylenol or 3200mg of Motrin in a 24-hour period. °Do not take ibuprofen/Motrin if you have a history of bleeding stomach ulcers, severe kidney disease, &/or actively taking a blood thinner ° °What if I still have pain? °If you have pain that is not controlled with the over-the-counter pain medications (Tylenol and Motrin or Advil) you might have what we call “breakthrough” pain. You will receive a prescription   for a small amount of an opioid pain medication such as Oxycodone, Tramadol, or Tylenol with Codeine. Use these opioid pills in the first 24 hours after surgery if you have breakthrough pain. Do not take more than 1 pill every 4-6 hours. ° °If you still have uncontrolled pain after using all opioid pills, don't hesitate to call our staff using the number provided. We will help make sure you are managing your pain in the best way possible, and if necessary, we can provide a prescription for additional pain medication. ° ° °Day 1   ° °Time  °Name of Medication Number of pills taken  °Amount of Acetaminophen  °Pain Level  ° °Comments  °AM PM       °AM PM       °AM PM       °AM PM       °AM PM       °AM PM       °AM PM       °AM PM       °Total Daily amount of Acetaminophen °Do not take more than  3,000 mg per day    ° ° °Day 2   ° °Time  °Name of Medication  Number of pills °taken  °Amount of Acetaminophen  °Pain Level  ° °Comments  °AM PM       °AM PM       °AM PM       °AM PM       °AM PM       °AM PM       °AM PM       °AM PM       °Total Daily amount of Acetaminophen °Do not take more than  3,000 mg per day    ° ° °Day 3   ° °Time  °Name of Medication Number of pills taken  °Amount of Acetaminophen  °Pain Level  ° °Comments  °AM PM       °AM PM       °AM PM       °AM PM       ° ° ° °AM PM       °AM PM       °AM PM       °AM PM       °Total Daily amount of Acetaminophen °Do not take more than  3,000 mg per day    ° ° °Day 4   ° °Time  °Name of Medication Number of pills taken  °Amount of Acetaminophen  °Pain Level  ° °Comments  °AM PM       °AM PM       °AM PM       °AM PM       °AM PM       °AM PM       °AM PM       °AM PM       °Total Daily amount of Acetaminophen °Do not take more than  3,000 mg per day    ° ° °Day 5   ° °Time  °Name of Medication Number °of pills taken  °Amount of Acetaminophen  °Pain Level  ° °Comments  °AM PM       °AM PM       °AM PM       °AM PM       °AM PM       °AM   PM       °AM PM       °AM PM       °Total Daily amount of Acetaminophen °Do not take more than  3,000 mg per day    ° ° ° °Day 6   ° °Time  °Name of Medication Number of pills °taken  °Amount of Acetaminophen  °Pain Level  °Comments  °AM PM       °AM PM       °AM PM       °AM PM       °AM PM       °AM PM       °AM PM       °AM PM       °Total Daily amount of Acetaminophen °Do not take more than  3,000 mg per day    ° ° °Day 7   ° °Time  °Name of Medication Number of pills taken  °Amount of Acetaminophen  °Pain Level  ° °Comments  °AM PM       °AM PM       °AM PM       °AM PM       °AM PM       °AM PM       °AM PM       °AM PM       °Total Daily amount of Acetaminophen °Do not take more than  3,000 mg per day    ° ° ° ° °For additional information about how and where to safely dispose of unused opioid °medications - https://www.morepowerfulnc.org ° °Disclaimer: This document  contains information and/or instructional materials adapted from [facility] for the typical patient with your condition. It does not replace medical advice from your health care provider because your experience may differ from that of the °typical patient. Talk to your health care provider if you have any questions about this °document, your condition or your treatment plan. °Adapted from [facility] ° °

## 2022-07-27 ENCOUNTER — Encounter (HOSPITAL_COMMUNITY): Payer: Self-pay | Admitting: General Surgery

## 2022-07-27 LAB — SURGICAL PATHOLOGY

## 2022-07-27 MED ORDER — OXYCODONE HCL 5 MG PO TABS: 5.0000 mg | ORAL_TABLET | ORAL | 0 refills | Status: AC | PRN

## 2022-07-27 MED ORDER — ACETAMINOPHEN 500 MG PO TABS: 1000.0000 mg | ORAL_TABLET | Freq: Four times a day (QID) | ORAL | 0 refills | Status: AC | PRN

## 2022-07-27 NOTE — Plan of Care (Signed)
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
## 2022-07-27 NOTE — Progress Notes (Signed)
Reviewed written d/c instructions w pt and all questions answered. She verbalized understanding. D/c via w/c w all belongings in stable condition. 

## 2022-08-03 IMAGING — DX DG CHEST 2V
2 series · 2 of 2 positions shown · non-contrast
Comparison: 12/15/2020

CLINICAL DATA: Pneumonia, follow-up examination

EXAM:
CHEST - 2 VIEW

[chest pa]
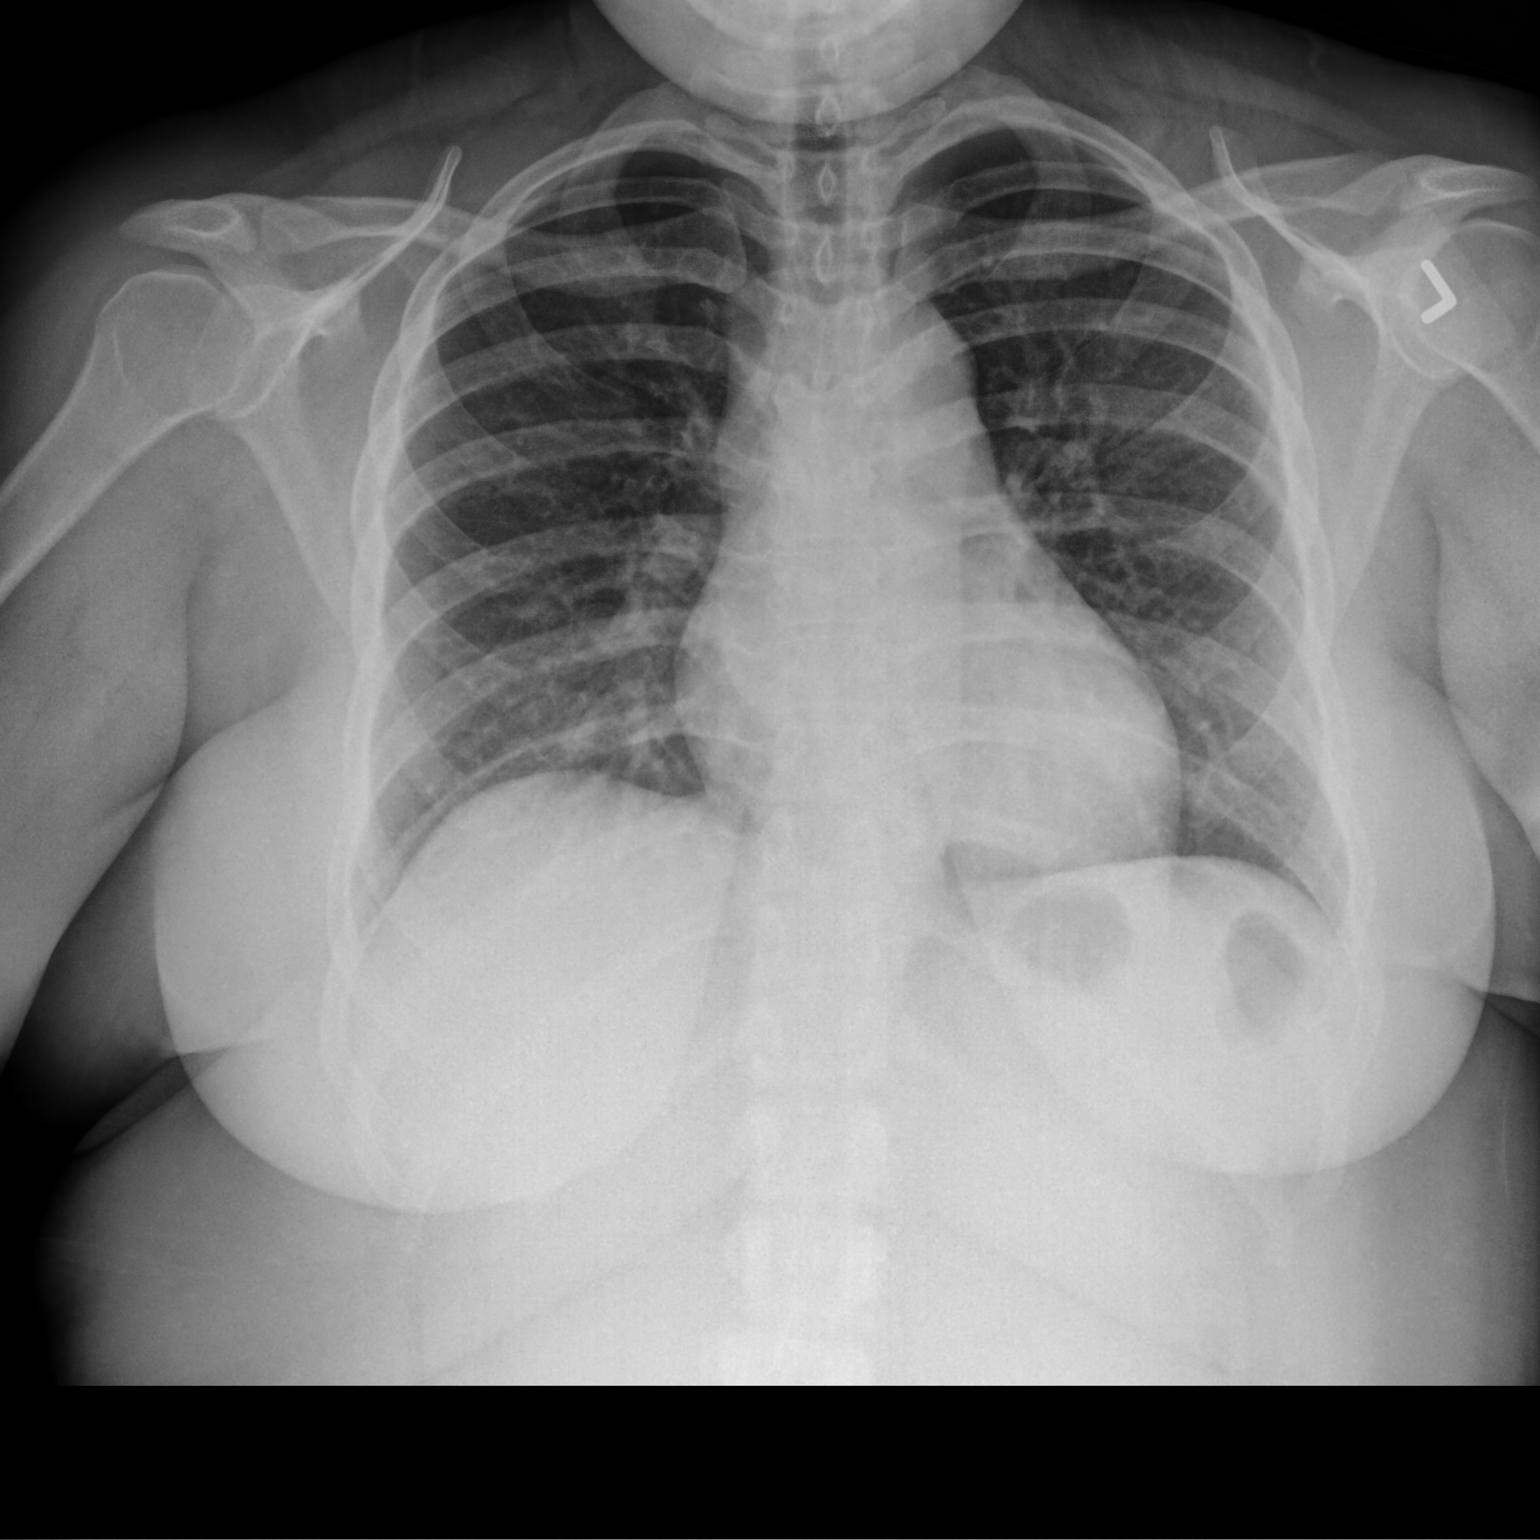

[chest lat]
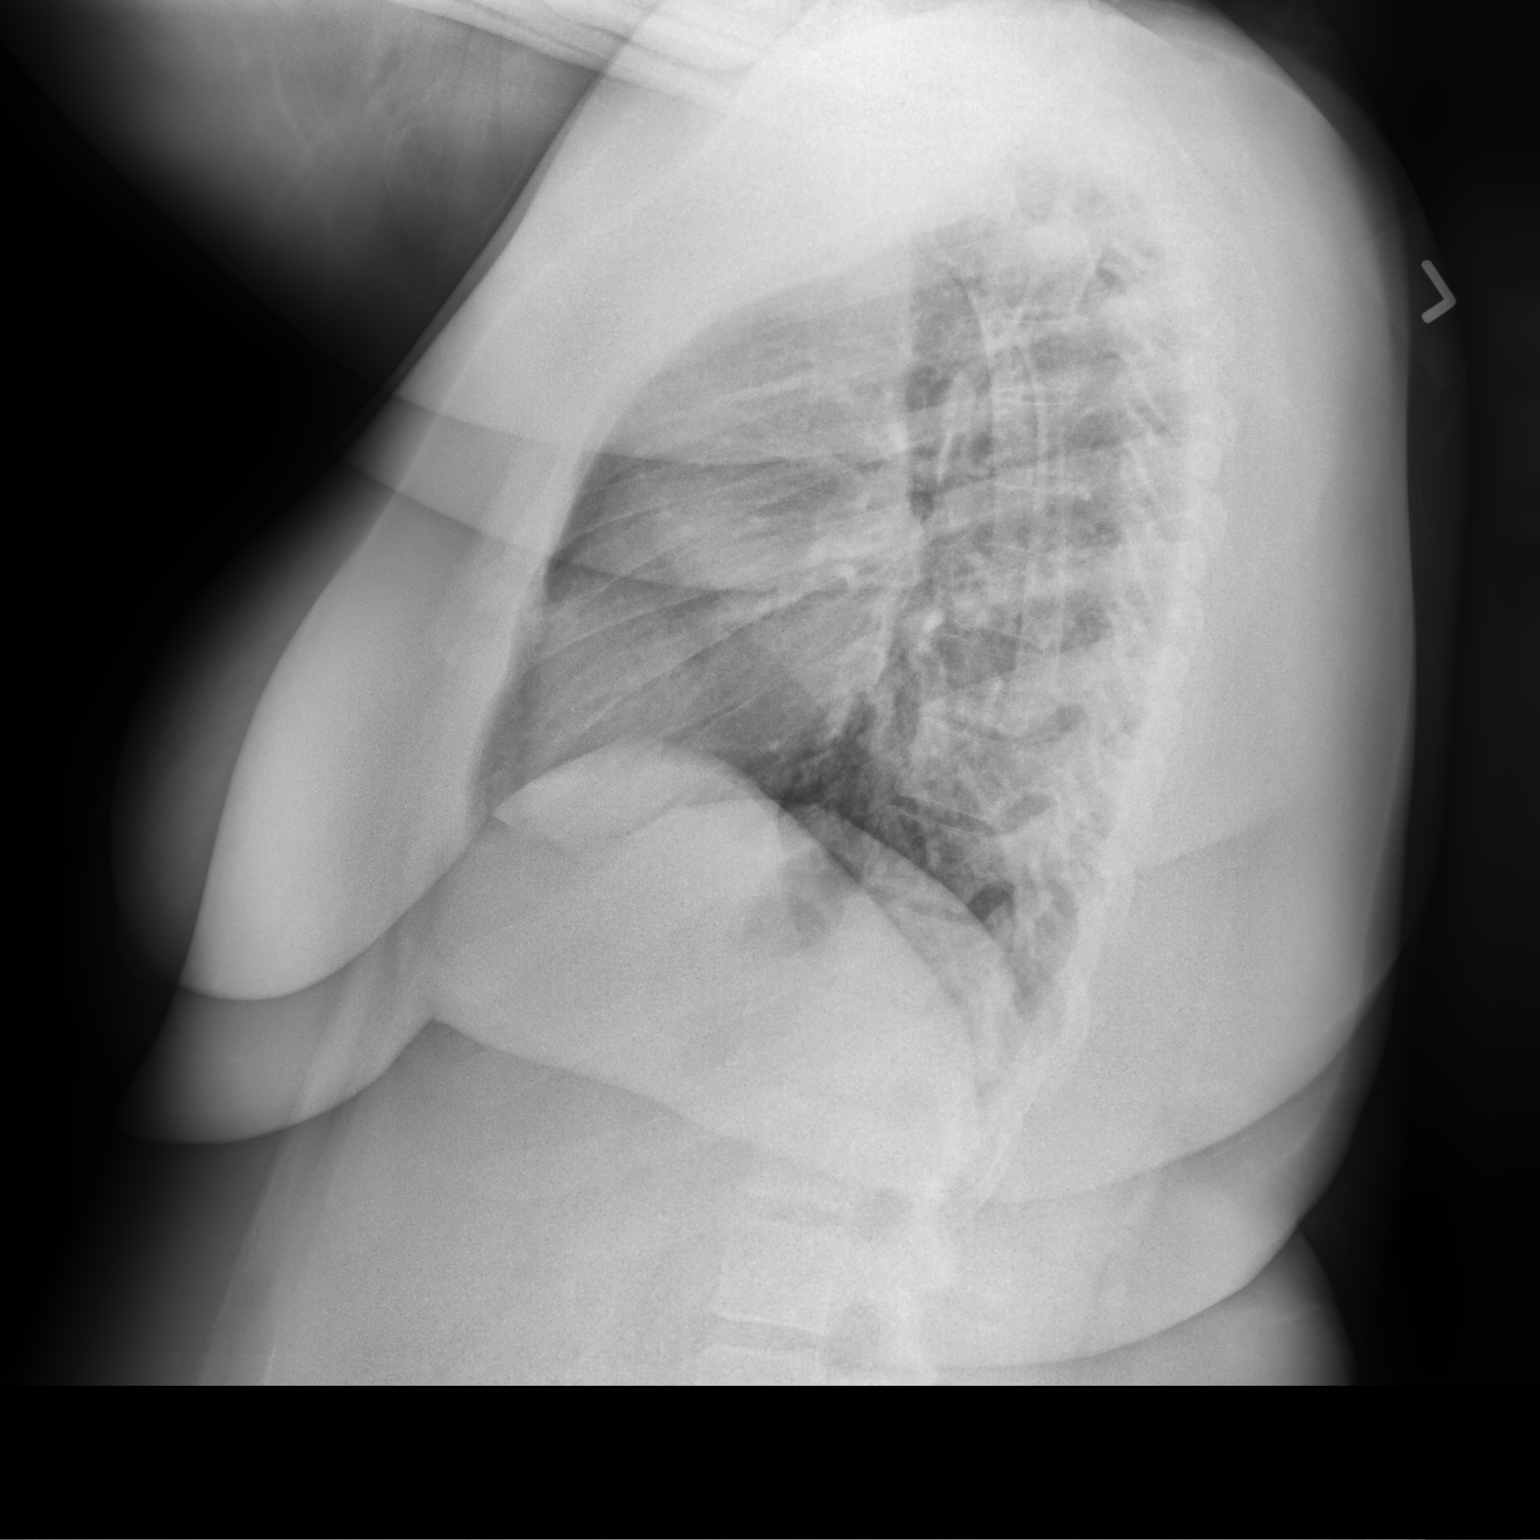

[2 of 2 positions shown; findings below may reference images not displayed]

FINDINGS: Focal pulmonary infiltrate noted within the right upper lobe on
prior examination has nearly completely resolved. No pneumothorax or
pleural effusion. Cardiac size within normal limits. Pulmonary
vascularity is normal. No acute bone abnormality.
IMPRESSION: Nearly resolved right upper lobe pneumonic infiltrate.

## 2023-12-11 ENCOUNTER — Other Ambulatory Visit: Payer: Self-pay

## 2023-12-11 ENCOUNTER — Emergency Department
Admission: EM | Admit: 2023-12-11 | Discharge: 2023-12-11 | Disposition: A | Discharge status: Home / Self Care | Payer: Self-pay | Attending: Emergency Medicine | Admitting: Emergency Medicine

## 2023-12-11 DIAGNOSIS — T7421XA Adult sexual abuse, confirmed, initial encounter: Secondary | ICD-10-CM | POA: Insufficient documentation

## 2023-12-11 DIAGNOSIS — K6289 Other specified diseases of anus and rectum: Secondary | ICD-10-CM | POA: Insufficient documentation

## 2023-12-11 LAB — COMPREHENSIVE METABOLIC PANEL WITH GFR
ALT: 13 U/L (ref 0–44)
AST: 11 U/L — ABNORMAL LOW (ref 15–41)
Albumin: 3.6 g/dL (ref 3.5–5.0)
Alkaline Phosphatase: 73 U/L (ref 38–126)
Anion gap: 8 (ref 5–15)
BUN: 8 mg/dL (ref 6–20)
CO2: 24 mmol/L (ref 22–32)
Calcium: 8.8 mg/dL — ABNORMAL LOW (ref 8.9–10.3)
Chloride: 105 mmol/L (ref 98–111)
Creatinine, Ser: 0.71 mg/dL (ref 0.44–1.00)
GFR, Estimated: >60 mL/min (ref >60)
Glucose, Bld: 82 mg/dL (ref 70–99)
Potassium: 3.9 mmol/L (ref 3.5–5.1)
Sodium: 137 mmol/L (ref 135–145)
Total Bilirubin: 0.5 mg/dL (ref 0.0–1.2)
Total Protein: 8.5 g/dL — ABNORMAL HIGH (ref 6.5–8.1)

## 2023-12-11 LAB — CBC WITH DIFFERENTIAL/PLATELET
Abs Immature Granulocytes: 0.01 K/uL (ref 0.00–0.07)
Basophils Absolute: 0 K/uL (ref 0.0–0.1)
Basophils Relative: 0 %
Eosinophils Absolute: 0.1 K/uL (ref 0.0–0.5)
Eosinophils Relative: 1 %
HCT: 37.9 % (ref 36.0–46.0)
Hemoglobin: 12.3 g/dL (ref 12.0–15.0)
Immature Granulocytes: 0 %
Lymphocytes Relative: 36 %
Lymphs Abs: 2.7 K/uL (ref 0.7–4.0)
MCH: 28.5 pg (ref 26.0–34.0)
MCHC: 32.5 g/dL (ref 30.0–36.0)
MCV: 87.7 fL (ref 80.0–100.0)
Monocytes Absolute: 0.4 K/uL (ref 0.1–1.0)
Monocytes Relative: 6 %
Neutro Abs: 4.1 K/uL (ref 1.7–7.7)
Neutrophils Relative %: 57 %
Platelets: 323 K/uL (ref 150–400)
RBC: 4.32 MIL/uL (ref 3.87–5.11)
RDW: 13.1 % (ref 11.5–15.5)
WBC: 7.3 K/uL (ref 4.0–10.5)
nRBC: 0 % (ref 0.0–0.2)

## 2023-12-11 LAB — POC URINE PREG, ED: Preg Test, Ur: NEGATIVE

## 2023-12-11 MED ORDER — IBUPROFEN 600 MG PO TABS
600.0000 mg | ORAL_TABLET | Freq: Once | ORAL | Status: AC | PRN
  Administered 2023-12-11: 600 mg via ORAL
  Filled 2023-12-11: qty 1

## 2023-12-11 NOTE — ED Notes (Signed)
Sane nurse paged.
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
## 2023-12-11 NOTE — SANE Note (Signed)
 On 12/11/2023, at approximately 1505 hours, the SANE/FNE Teacher, music) consult was completed. The primary RN and provider have been notified. Please contact the SANE/FNE nurse on call (listed in Amion) with any further concerns.

## 2023-12-11 NOTE — ED Notes (Signed)
Sane nurse called.

## 2023-12-11 NOTE — SANE Note (Signed)
 The SANE/FNE RN will be in to see the patient in approximately 1.00-1.50 hours.
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
## 2023-12-11 NOTE — SANE Note (Signed)
 The SANE/FNE RN is aware of the patient.    If patient needs to void, please collect a dirty urine specimen.  Retain the post-void toilet tissue with the patient.  The patient is waiting to be evaluated by an ED Provider.    I will monitor the patient's status to see how her care is progressing.  I can be reached at:  [phone].
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
## 2023-12-28 ENCOUNTER — Ambulatory Visit: Payer: Self-pay | Admitting: Obstetrics and Gynecology
